# Patient Record
Sex: Female | Born: 1995 | Race: Black or African American | Hispanic: No | Marital: Single | Smoking: Never smoker
Health system: Southern US, Community
[De-identification: ages and names within clinical notes are randomized; demographics above are authoritative.]

## PROBLEM LIST (undated history)

## (undated) ENCOUNTER — Inpatient Hospital Stay (HOSPITAL_COMMUNITY): Payer: Self-pay

## (undated) DIAGNOSIS — R519 Headache, unspecified: Secondary | ICD-10-CM

## (undated) DIAGNOSIS — B3731 Acute candidiasis of vulva and vagina: Secondary | ICD-10-CM

## (undated) DIAGNOSIS — F329 Major depressive disorder, single episode, unspecified: Secondary | ICD-10-CM

## (undated) DIAGNOSIS — B373 Candidiasis of vulva and vagina: Secondary | ICD-10-CM

## (undated) DIAGNOSIS — B9689 Other specified bacterial agents as the cause of diseases classified elsewhere: Secondary | ICD-10-CM

## (undated) DIAGNOSIS — F419 Anxiety disorder, unspecified: Secondary | ICD-10-CM

## (undated) DIAGNOSIS — Z789 Other specified health status: Secondary | ICD-10-CM

## (undated) DIAGNOSIS — Z202 Contact with and (suspected) exposure to infections with a predominantly sexual mode of transmission: Secondary | ICD-10-CM

## (undated) DIAGNOSIS — F32A Depression, unspecified: Secondary | ICD-10-CM

## (undated) HISTORY — PX: NO PAST SURGERIES: SHX2092

---
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
## 2013-10-26 ENCOUNTER — Ambulatory Visit (INDEPENDENT_AMBULATORY_CARE_PROVIDER_SITE_OTHER): Payer: BC Managed Care – PPO | Admitting: Physician Assistant

## 2013-10-26 VITALS — BP 120/80 | HR 88 | Temp 98.6°F | Resp 18 | Ht 67.0 in | Wt 138.1 lb

## 2013-10-26 DIAGNOSIS — B9689 Other specified bacterial agents as the cause of diseases classified elsewhere: Secondary | ICD-10-CM

## 2013-10-26 DIAGNOSIS — Z113 Encounter for screening for infections with a predominantly sexual mode of transmission: Secondary | ICD-10-CM

## 2013-10-26 DIAGNOSIS — N760 Acute vaginitis: Secondary | ICD-10-CM

## 2013-10-26 DIAGNOSIS — Z309 Encounter for contraceptive management, unspecified: Secondary | ICD-10-CM

## 2013-10-26 DIAGNOSIS — N898 Other specified noninflammatory disorders of vagina: Secondary | ICD-10-CM

## 2013-10-26 LAB — POCT WET PREP WITH KOH
KOH Prep POC: NEGATIVE
Trichomonas, UA: NEGATIVE
Yeast Wet Prep HPF POC: NEGATIVE

## 2013-10-26 LAB — POCT URINE PREGNANCY: Preg Test, Ur: NEGATIVE

## 2013-10-26 MED ORDER — AMBULATORY NON FORMULARY MEDICATION: Status: DC

## 2013-10-26 MED ORDER — METRONIDAZOLE 0.75 % EX GEL: 1.0000 "application " | Freq: Two times a day (BID) | CUTANEOUS | Status: DC

## 2013-10-26 MED ORDER — NORGESTIMATE-ETH ESTRADIOL 0.25-35 MG-MCG PO TABS: 1.0000 | ORAL_TABLET | Freq: Every day | ORAL | Status: DC

## 2013-10-26 NOTE — Progress Notes (Signed)
Subjective:    Patient ID: Kathleen Zhang, female    DOB: 06/09/1996, 18 y.o.   MRN: [id number]  HPI 18 year old female presents for evaluation of recurrent BV and vaginal bleeding/spotting.  She started Depo provera in 20/2014 and has had bleeding and spotting ever since.  Admits she tried Depo before about 2 years ago and had the same problem - did not get her second shot then or now.  She is here today because she is concerned that she has BV. This has been a recurrent problem and she typically has it every 2-3 months.  She is sexually active with 1 female partner - has not been with him since 08/2013 - had STI screening in 09/2013 that was all negative.  States she uses condoms regularly.   Denies abdominal pain, nausea, vomiting, fever,chills, dysuria, urinary frequency, or flank pain.      Review of Systems  Constitutional: Negative for fever and chills.  Gastrointestinal: Negative for vomiting and abdominal pain.  Genitourinary: Positive for vaginal bleeding, vaginal discharge and menstrual problem. Negative for dysuria, frequency and vaginal pain.  Skin: Negative for rash.       Objective:   Physical Exam  Constitutional: She is oriented to person, place, and time. She appears well-developed and well-nourished.  HENT:  Head: Normocephalic and atraumatic.  Right Ear: External ear normal.  Left Ear: External ear normal.  Eyes: Conjunctivae are normal.  Neck: Normal range of motion.  Cardiovascular: Normal rate.   Pulmonary/Chest: Effort normal.  Genitourinary: Vagina normal and uterus normal. Pelvic exam was performed with patient supine. There is no rash, tenderness or lesion on the right labia. There is no rash, tenderness or lesion on the left labia. Cervix exhibits discharge. Cervix exhibits no motion tenderness. Right adnexum displays tenderness (slight right adnexal tenderness). Right adnexum displays no mass and no fullness. Left adnexum displays no mass, no tenderness and no  fullness.  Neurological: She is alert and oriented to person, place, and time.  Psychiatric: She has a normal mood and affect. Her behavior is normal. Judgment and thought content normal.    Results for orders placed in visit on 10/26/13  POCT WET PREP WITH KOH      Result Value Ref Range   Trichomonas, UA Negative     Clue Cells Wet Prep HPF POC 0-3     Epithelial Wet Prep HPF POC 2-10     Yeast Wet Prep HPF POC NEG     Bacteria Wet Prep HPF POC 2+     RBC Wet Prep HPF POC tntc     WBC Wet Prep HPF POC 0-2     KOH Prep POC Negative    POCT URINE PREGNANCY      Result Value Ref Range   Preg Test, Ur Negative           Assessment & Plan:  Leukorrhea, not specified as infective - Plan: POCT Wet Prep with KOH  Contraception management - Plan: POCT urine pregnancy, norgestimate-ethinyl estradiol (ORTHO-CYCLEN,SPRINTEC,PREVIFEM) 0.25-35 MG-MCG tablet  Screening for STD (sexually transmitted disease) - Plan: GC/Chlamydia Probe Amp  Bacterial vaginosis - Plan: AMBULATORY NON FORMULARY MEDICATION, metroNIDAZOLE (METROGEL) 0.75 % gel  Will treat for suspected BV with metrogel PV bid x 5 days Start boric acid suppositories after completion of metrogel - once PV at bedtime x 21 days Genprobe pending DUB likely due to depo-provera - treat with OCP's to start today. May continue if desired to use as contraception.  Discussed Nexplanon - ok to place GYN referral if she wishes to proceed with this.  Follow up if symptoms worsen or fail to improve.

## 2013-10-27 ENCOUNTER — Telehealth: Payer: Self-pay | Admitting: *Deleted

## 2013-10-27 MED ORDER — METRONIDAZOLE 0.75 % VA GEL: 1.0000 | Freq: Two times a day (BID) | VAGINAL | Status: DC

## 2013-10-27 NOTE — Telephone Encounter (Signed)
rx for metrogel was sent in as topical and it was supposed to be vaginal gel.  rx for vaginal gel sent in.

## 2013-10-28 LAB — GC/CHLAMYDIA PROBE AMP
CT Probe RNA: NEGATIVE
GC Probe RNA: NEGATIVE

## 2013-11-29 ENCOUNTER — Emergency Department (HOSPITAL_COMMUNITY)
Admission: EM | Admit: 2013-11-29 | Discharge: 2013-11-29 | Disposition: A | Discharge status: Home / Self Care | Payer: BC Managed Care – PPO | Attending: Emergency Medicine | Admitting: Emergency Medicine

## 2013-11-29 ENCOUNTER — Encounter (HOSPITAL_COMMUNITY): Payer: Self-pay | Admitting: Emergency Medicine

## 2013-11-29 ENCOUNTER — Emergency Department (HOSPITAL_COMMUNITY): Payer: BC Managed Care – PPO

## 2013-11-29 DIAGNOSIS — S01511A Laceration without foreign body of lip, initial encounter: Secondary | ICD-10-CM

## 2013-11-29 DIAGNOSIS — R1110 Vomiting, unspecified: Secondary | ICD-10-CM | POA: Insufficient documentation

## 2013-11-29 DIAGNOSIS — W19XXXA Unspecified fall, initial encounter: Secondary | ICD-10-CM | POA: Insufficient documentation

## 2013-11-29 DIAGNOSIS — S0993XA Unspecified injury of face, initial encounter: Secondary | ICD-10-CM

## 2013-11-29 DIAGNOSIS — Y929 Unspecified place or not applicable: Secondary | ICD-10-CM | POA: Insufficient documentation

## 2013-11-29 DIAGNOSIS — Z3202 Encounter for pregnancy test, result negative: Secondary | ICD-10-CM | POA: Insufficient documentation

## 2013-11-29 DIAGNOSIS — Y939 Activity, unspecified: Secondary | ICD-10-CM | POA: Insufficient documentation

## 2013-11-29 DIAGNOSIS — F1010 Alcohol abuse, uncomplicated: Secondary | ICD-10-CM | POA: Insufficient documentation

## 2013-11-29 DIAGNOSIS — S0990XA Unspecified injury of head, initial encounter: Secondary | ICD-10-CM | POA: Insufficient documentation

## 2013-11-29 DIAGNOSIS — Z79899 Other long term (current) drug therapy: Secondary | ICD-10-CM | POA: Insufficient documentation

## 2013-11-29 DIAGNOSIS — F10929 Alcohol use, unspecified with intoxication, unspecified: Secondary | ICD-10-CM

## 2013-11-29 DIAGNOSIS — S025XXA Fracture of tooth (traumatic), initial encounter for closed fracture: Secondary | ICD-10-CM | POA: Insufficient documentation

## 2013-11-29 DIAGNOSIS — R4182 Altered mental status, unspecified: Secondary | ICD-10-CM | POA: Insufficient documentation

## 2013-11-29 DIAGNOSIS — Z8619 Personal history of other infectious and parasitic diseases: Secondary | ICD-10-CM | POA: Insufficient documentation

## 2013-11-29 DIAGNOSIS — S01501A Unspecified open wound of lip, initial encounter: Secondary | ICD-10-CM | POA: Insufficient documentation

## 2013-11-29 DIAGNOSIS — IMO0002 Reserved for concepts with insufficient information to code with codable children: Secondary | ICD-10-CM | POA: Insufficient documentation

## 2013-11-29 LAB — COMPREHENSIVE METABOLIC PANEL
ALBUMIN: 4 g/dL (ref 3.5–5.2)
ALT: 12 U/L (ref 0–35)
AST: 21 U/L (ref 0–37)
Alkaline Phosphatase: 56 U/L (ref 39–117)
BUN: 9 mg/dL (ref 6–23)
CALCIUM: 8.7 mg/dL (ref 8.4–10.5)
CO2: 19 mEq/L (ref 19–32)
Chloride: 106 mEq/L (ref 96–112)
Creatinine, Ser: 0.67 mg/dL (ref 0.50–1.10)
GFR calc non Af Amer: >90 mL/min (ref >90)
Glucose, Bld: 170 mg/dL — ABNORMAL HIGH (ref 70–99)
POTASSIUM: 4 meq/L (ref 3.7–5.3)
SODIUM: 143 meq/L (ref 137–147)
TOTAL PROTEIN: 8 g/dL (ref 6.0–8.3)

## 2013-11-29 LAB — RAPID URINE DRUG SCREEN, HOSP PERFORMED
Amphetamines: NOT DETECTED
Barbiturates: NOT DETECTED
Benzodiazepines: NOT DETECTED
COCAINE: NOT DETECTED
Opiates: NOT DETECTED
TETRAHYDROCANNABINOL: NOT DETECTED

## 2013-11-29 LAB — CBC
HEMATOCRIT: 38.7 % (ref 36.0–46.0)
Hemoglobin: 13.8 g/dL (ref 12.0–15.0)
MCH: 31.7 pg (ref 26.0–34.0)
MCHC: 35.7 g/dL (ref 30.0–36.0)
MCV: 89 fL (ref 78.0–100.0)
PLATELETS: 209 10*3/uL (ref 150–400)
RBC: 4.35 MIL/uL (ref 3.87–5.11)
RDW: 11.5 % (ref 11.5–15.5)
WBC: 6.3 10*3/uL (ref 4.0–10.5)

## 2013-11-29 LAB — POC URINE PREG, ED: Preg Test, Ur: NEGATIVE

## 2013-11-29 LAB — ETHANOL: Alcohol, Ethyl (B): 272 mg/dL — ABNORMAL HIGH (ref 0–11)

## 2013-11-29 MED ORDER — ONDANSETRON HCL 4 MG/2ML IJ SOLN
4.0000 mg | Freq: Once | INTRAMUSCULAR | Status: AC
  Administered 2013-11-29: 4 mg via INTRAVENOUS
  Filled 2013-11-29: qty 2

## 2013-11-29 MED ORDER — ONDANSETRON HCL 4 MG/2ML IJ SOLN: 4.0000 mg | Freq: Once | INTRAMUSCULAR | Status: AC

## 2013-11-29 MED ORDER — ACETAMINOPHEN 325 MG PO TABS
650.0000 mg | ORAL_TABLET | Freq: Once | ORAL | Status: AC
  Administered 2013-11-29: 650 mg via ORAL
  Filled 2013-11-29: qty 2

## 2013-11-29 MED ORDER — ONDANSETRON HCL 8 MG PO TABS: 8.0000 mg | ORAL_TABLET | Freq: Three times a day (TID) | ORAL | Status: DC | PRN

## 2013-11-29 MED ORDER — ONDANSETRON HCL 4 MG/2ML IJ SOLN
INTRAMUSCULAR | Status: AC
  Administered 2013-11-29: 4 mg via INTRAVENOUS
  Filled 2013-11-29: qty 2

## 2013-11-29 MED ORDER — ONDANSETRON HCL 4 MG/2ML IJ SOLN
4.0000 mg | Freq: Once | INTRAMUSCULAR | Status: AC
  Administered 2013-11-29: 4 mg via INTRAVENOUS

## 2013-11-29 NOTE — ED Provider Notes (Signed)
CSN: [id number]     Arrival date & time 11/29/13  0140 History   First MD Initiated Contact with Patient 11/29/13 [phone]     Chief Complaint  Patient presents with  . Alcohol Intoxication  . Altered Mental Status  . Emesis  . Head Injury     (Consider location/radiation/quality/duration/timing/severity/associated sxs/prior Treatment) Patient is a 18 y.o. female presenting with intoxication, altered mental status, vomiting, and head injury. The history is provided by the patient and a parent.  Alcohol Intoxication  Altered Mental Status Associated symptoms: vomiting   Emesis Head Injury Associated symptoms: vomiting    Parents bring her here for evaluation after a fall, and suspected alcohol intoxication. The patient was out with friends tonight, when her mother called her cell phone, and discovered that she had been drinking. The parents, took her home, then brought her here after they realized that she had a cut lip.  Level V caveat- altered mental status   Past Medical History  Diagnosis Date  . Chlamydia    History reviewed. No pertinent past surgical history. No family history on file. History  Substance Use Topics  . Smoking status: Never Smoker   . Smokeless tobacco: Never Used  . Alcohol Use: Yes   OB History   Grav Para Term Preterm Abortions TAB SAB Ect Mult Living                 Review of Systems  Gastrointestinal: Positive for vomiting.  All other systems reviewed and are negative.     Allergies  Review of patient's allergies indicates no known allergies.  Home Medications   Prior to Admission medications   Medication Sig Start Date End Date Taking? Authorizing Provider  AMBULATORY NON FORMULARY MEDICATION Boric Acid Suppository 600 mg Insert 1 PV at bedtime x 3 weeks 10/26/13   Nelva Nay, PA-C  metroNIDAZOLE (METROGEL VAGINAL) 0.75 % vaginal gel Place 1 Applicatorful vaginally 2 (two) times daily. 10/27/13   Heather Jaquita Rector, PA-C   norgestimate-ethinyl estradiol (ORTHO-CYCLEN,SPRINTEC,PREVIFEM) 0.25-35 MG-MCG tablet Take 1 tablet by mouth daily. 10/26/13   Heather M Marte, PA-C   BP 111/49  Pulse 96  Temp(Src) 97.9 F (36.6 C) (Rectal)  Resp 17  SpO2 100% Physical Exam  Nursing note and vitals reviewed. Constitutional: She is oriented to person, place, and time. She appears well-developed and well-nourished.  HENT:  Head: Normocephalic.  No cranial deformity, crepitation, or laceration. Mucosal laceration, and not gaping, or bleeding of the middle, lower lip. No evident dental trauma.  Eyes: Conjunctivae and EOM are normal. Pupils are equal, round, and reactive to light.  Neck: Normal range of motion and phonation normal.  Cardiovascular: Normal rate, regular rhythm and intact distal pulses.   Pulmonary/Chest: Effort normal and breath sounds normal. She exhibits no tenderness.  Abdominal: Soft. She exhibits no distension. There is no tenderness. There is no guarding.  Musculoskeletal: Normal range of motion.  Neurological: She is oriented to person, place, and time. She exhibits normal muscle tone.  She is obtunded. She does not follow commands. During the exam, she sat up, began crying and asking for her mother.  Skin: Skin is warm and dry.  Minor bilateral anterior knee abrasions  Psychiatric: She has a normal mood and affect. Her behavior is normal. Judgment and thought content normal.    ED Course  Procedures (including critical care time)   Medications  ondansetron (ZOFRAN) injection 4 mg (4 mg Intravenous Given 11/29/13 0152)  ondansetron (ZOFRAN) injection  4 mg (0 mg Intravenous Duplicate 11/29/13 0202)  ondansetron (ZOFRAN) injection 4 mg (4 mg Intravenous Given 11/29/13 0619)  acetaminophen (TYLENOL) tablet 650 mg (650 mg Oral Given 11/29/13 0620)    Patient Vitals for the past 24 hrs:  BP Temp Temp src Pulse Resp SpO2  11/29/13 0445 111/49 mmHg - - - 17 -  11/29/13 0415 122/74 mmHg - - 96 14 100 %   11/29/13 0345 102/52 mmHg - - 85 17 97 %  11/29/13 0315 112/52 mmHg - - 93 18 98 %  11/29/13 0301 110/57 mmHg - - 89 17 100 %  11/29/13 0145 - 97.9 F (36.6 C) Rectal - - -  11/29/13 0143 97/83 mmHg - - 91 12 97 %    7:14 AM Reevaluation with update and discussion. After initial assessment and treatment, an updated evaluation reveals she is alert and nonconversant, still confused. She complains of a chipped lower a anterior incisor. Findings discussed with patient and family member, all questions answered. Flint MelterElliott L Marqueta Pulley     Labs Review Labs Reviewed  COMPREHENSIVE METABOLIC PANEL - Abnormal; Notable for the following:    Glucose, Bld 170 (*)    Total Bilirubin <0.2 (*)    All other components within normal limits  ETHANOL - Abnormal; Notable for the following:    Alcohol, Ethyl (B) 272 (*)    All other components within normal limits  CBC  URINE RAPID DRUG SCREEN (HOSP PERFORMED)  POC URINE PREG, ED    Imaging Review Ct Head Wo Contrast  11/29/2013   CLINICAL DATA:  Recent traumatic injury  EXAM: CT HEAD WITHOUT CONTRAST  TECHNIQUE: Contiguous axial images were obtained from the base of the skull through the vertex without intravenous contrast.  COMPARISON:  None.  FINDINGS: The bony calvarium is intact. The ventricles are normal size and configuration. No findings to suggest acute hemorrhage, acute infarction or space-occupying mass lesion are noted.  IMPRESSION: No acute abnormality noted.   Electronically Signed   By: Alcide CleverMark  Lukens M.D.   On: 11/29/2013 02:29   Ct Cervical Spine Wo Contrast  11/29/2013   CLINICAL DATA:  Altered mental status.  Patient fell and hit head.  EXAM: CT CERVICAL SPINE WITHOUT CONTRAST  TECHNIQUE: Multidetector CT imaging of the cervical spine was performed without intravenous contrast. Multiplanar CT image reconstructions were also generated.  COMPARISON:  CT HEAD W/O CM dated 11/29/2013  FINDINGS: There is reversal of the usual cervical lordosis which  is probably due to patient positioning but ligamentous injury or muscle spasm can also have this appearance and are not excluded. No anterior subluxation. Facet joints demonstrate normal alignment. C1-2 articulation appears intact. No focal bone lesion or bone destruction. Bone cortex and trabecular architecture appear intact. No vertebral compression deformities. No prevertebral soft tissue swelling.  IMPRESSION: Nonspecific reversal of the usual cervical lordosis. No displaced fractures are identified.   Electronically Signed   By: Burman NievesWilliam  Stevens M.D.   On: 11/29/2013 06:13     EKG Interpretation None      MDM   Final diagnoses:  Alcohol intoxication  Fall  Laceration of lip  Dental injury   Alcohol intoxication with fall, but no serious injury. Her parents are taking her home and are able to manage her there.  Nursing Notes Reviewed/ Care Coordinated Applicable Imaging Reviewed Interpretation of Laboratory Data incorporated into ED treatment  The patient appears reasonably screened and/or stabilized for discharge and I doubt any other medical condition or other  EMC requiring further screening, evaluation, or treatment in the ED at this time prior to discharge.  Plan: Home Medications- Zofran; Home Treatments- gradually advance diet; return here if the recommended treatment, does not improve the symptoms; Recommended follow up- PCP of choice prn. See dentist for check up asap    Flint MelterElliott L Danette Weinfeld, MD 11/29/13 [phone]

## 2013-11-29 NOTE — Discharge Instructions (Signed)
Avoid alcohol in heavy doses. Gradually advance her diet today. See, your dentist as soon as possible about the dental injury.   Alcohol Intoxication Alcohol intoxication occurs when the amount of alcohol that a person has consumed impairs his or her ability to mentally and physically function. Alcohol directly impairs the normal chemical activity of the brain. Drinking large amounts of alcohol can lead to changes in mental function and behavior, and it can cause many physical effects that can be harmful.  Alcohol intoxication can range in severity from mild to very severe. Various factors can affect the level of intoxication that occurs, such as the person's age, gender, weight, frequency of alcohol consumption, and the presence of other medical conditions (such as diabetes, seizures, or heart conditions). Dangerous levels of alcohol intoxication may occur when people drink large amounts of alcohol in a short period (binge drinking). Alcohol can also be especially dangerous when combined with certain prescription medicines or "recreational" drugs. SIGNS AND SYMPTOMS Some common signs and symptoms of mild alcohol intoxication include:  Loss of coordination.  Changes in mood and behavior.  Impaired judgment.  Slurred speech. As alcohol intoxication progresses to more severe levels, other signs and symptoms will appear. These may include:  Vomiting.  Confusion and impaired memory.  Slowed breathing.  Seizures.  Loss of consciousness. DIAGNOSIS  Your health care provider will take a medical history and perform a physical exam. You will be asked about the amount and type of alcohol you have consumed. Blood tests will be done to measure the concentration of alcohol in your blood. In many places, your blood alcohol level must be lower than 80 mg/dL (1.61%0.08%) to legally drive. However, many dangerous effects of alcohol can occur at much lower levels.  TREATMENT  People with alcohol  intoxication often do not require treatment. Most of the effects of alcohol intoxication are temporary, and they go away as the alcohol naturally leaves the body. Your health care provider will monitor your condition until you are stable enough to go home. Fluids are sometimes given through an IV access tube to help prevent dehydration.  HOME CARE INSTRUCTIONS  Do not drive after drinking alcohol.  Stay hydrated. Drink enough water and fluids to keep your urine clear or pale yellow. Avoid caffeine.   Only take over-the-counter or prescription medicines as directed by your health care provider.  SEEK MEDICAL CARE IF:   You have persistent vomiting.   You do not feel better after a few days.  You have frequent alcohol intoxication. Your health care provider can help determine if you should see a substance use treatment counselor. SEEK IMMEDIATE MEDICAL CARE IF:   You become shaky or tremble when you try to stop drinking.   You shake uncontrollably (seizure).   You throw up (vomit) blood. This may be bright red or may look like black coffee grounds.   You have blood in your stool. This may be bright red or may appear as a black, tarry, bad smelling stool.   You become lightheaded or faint.  MAKE SURE YOU:   Understand these instructions.  Will watch your condition.  Will get help right away if you are not doing well or get worse. Document Released: 04/16/2005 Document Revised: 03/09/2013 Document Reviewed: 12/10/2012 [facility] Patient Information 2014 [place], [place].  Dental Injury Your exam shows that you have injured your teeth. The treatment of broken teeth and other dental injuries depends on how badly they are hurt. All dental injuries should be  checked as soon as possible by a dentist if there are:  Loose teeth which may need to be wired or bonded with a plastic device to hold them in place.  Broken teeth with exposed tooth pulp which may cause a serious  infection.  Painful teeth especially when you bite or chew.  Sharp tooth edges that cut your tongue or lips. Sometimes, antibiotics or pain medicine are prescribed to prevent infection and control pain. Eat a soft or liquid diet and rinse your mouth out after meals with warm water. You should see a dentist or return here at once if you have increased swelling, increased pain or uncontrolled bleeding from the site of your injury. SEEK MEDICAL CARE IF:   You have increased pain not controlled with medicines.  You have swelling around your tooth, in your face or neck.  You have bleeding which starts, continues, or gets worse.  You have a fever. Document Released: 07/07/2005 Document Revised: 09/29/2011 Document Reviewed: 07/06/2009 [facility] Patient Information 2014 [place], [place].  Head Injury, Adult You have a head injury. Headaches and throwing up (vomiting) are common after a head injury. It should be easy to wake up from sleeping. Sometimes you must stay in the hospital. Most problems happen within the first 24 hours. Side effects may occur up to 7 10 days after the injury.  WHAT ARE THE TYPES OF HEAD INJURIES? Head injuries can be as minor as a bump. Some head injuries can be more severe. More severe head injuries include:  A jarring injury to the brain (concussion).  A bruise of the brain (contusion). This mean there is bleeding in the brain that can cause swelling.  A cracked skull (skull fracture).  Bleeding in the brain that collects, clots, and forms a bump (hematoma). . WHEN SHOULD I GET HELP RIGHT AWAY?   You are confused or sleepy.  You cannot be woken up.  You feel sick to your stomach (nauseous) or keep throwing up.  Your dizziness or unsteadiness is get worse.  You have very bad, lasting headaches that are not helped by medicine.  You cannot use your arms or legs like normal  You cannot walk.  You notice changes in the black spots in the center of the  colored part of your eye (pupil).  You have clear or bloody fluid coming from your nose or ears.  You have trouble seeing. During the next 24 hours after the injury, you must stay with someone who can watch you. This person should get help right away (call 911 in the [place] if you start to shake and are not able to control it (seizures), you become pass out, or you are unable to wake up. HOW CAN I PREVENT A HEAD INJURY IN THE FUTURE?  Wear seat belts.  Wear helmets while bike riding and playing sports like football.  Stay away from dangerous activities around the house. WHEN CAN I RETURN TO NORMAL ACTIVITIES AND ATHLETICS? See your doctor before doing these activities. You should not do normal activities or play contact sports until 1 week after the following symptoms have stopped:  Headache that does not go away.  Dizziness.  Poor attention.  Confusion.  Memory problems.  Sickness to your stomach or throwing up.  Tiredness.  Fussiness.  Bothered by bright lights or loud noises.  Anxiousness or depression.  Restless sleep. MAKE SURE YOU:   Understand these instructions.  Will watch your condition.  Will get help right away if you are  not doing well or get worse. Document Released: 06/19/2008 Document Revised: 04/27/2013 Document Reviewed: 03/14/2013 [facility] Patient Information 2014 [place], [place].

## 2013-11-29 NOTE — ED Notes (Signed)
Pt presents with c/o alcohol intoxication and altered mental status. Per pt's mother, pt was at a friend's house and mom called her cell phone and someone else answered the phone. Pt's friend reports that pt had been drinking and that she fell on the ground and hit her head. Pt has some bleeding to her lip, controlled at this time.

## 2014-01-23 ENCOUNTER — Ambulatory Visit (INDEPENDENT_AMBULATORY_CARE_PROVIDER_SITE_OTHER): Payer: BC Managed Care – PPO | Admitting: Physician Assistant

## 2014-01-23 VITALS — BP 110/60 | HR 94 | Temp 98.0°F | Resp 16 | Ht 66.0 in | Wt 137.0 lb

## 2014-01-23 DIAGNOSIS — Z30018 Encounter for initial prescription of other contraceptives: Secondary | ICD-10-CM

## 2014-01-23 DIAGNOSIS — N898 Other specified noninflammatory disorders of vagina: Secondary | ICD-10-CM

## 2014-01-23 DIAGNOSIS — N760 Acute vaginitis: Secondary | ICD-10-CM

## 2014-01-23 DIAGNOSIS — B9689 Other specified bacterial agents as the cause of diseases classified elsewhere: Secondary | ICD-10-CM

## 2014-01-23 DIAGNOSIS — Z113 Encounter for screening for infections with a predominantly sexual mode of transmission: Secondary | ICD-10-CM

## 2014-01-23 DIAGNOSIS — Z3009 Encounter for other general counseling and advice on contraception: Secondary | ICD-10-CM

## 2014-01-23 DIAGNOSIS — A499 Bacterial infection, unspecified: Secondary | ICD-10-CM

## 2014-01-23 LAB — POCT WET PREP WITH KOH
KOH Prep POC: NEGATIVE
Trichomonas, UA: NEGATIVE
Yeast Wet Prep HPF POC: NEGATIVE

## 2014-01-23 MED ORDER — METRONIDAZOLE 0.75 % VA GEL: 1.0000 | Freq: Every day | VAGINAL | Status: DC

## 2014-01-23 MED ORDER — AMBULATORY NON FORMULARY MEDICATION: Status: DC

## 2014-01-23 NOTE — Progress Notes (Signed)
   Subjective:    Patient ID: Kathleen Zhang, female    DOB: 16-May-1996, 18 y.o.   MRN: [id number]  HPI 18 year old female presents for evaluation of possible recurrence of BV. Was treated on 4/8 with Flagyl and started on 3 week course of boric acid qhs.  States that was working well and she has had no problems until this morning when she had intercourse and noticed a fishy odor. Has not noticed any discharge. Denies abdominal pain, dysuria, frequency, rash, fever, or chills.  She is sexually active with 1 female partner. Admits they did "take a break" for about 1 week and while she did not have any other sexual partners she is concerned maybe he did so would like to be screened today.  Had negative GC/CL in 10/2012. Neg HIV in 10/14.  Continues on OCP's. Admits she does miss doses occasionally; not using condoms. Does not wish to become pregnant at this time. Has considered nexplanon and would like to be scheduled to see a GYN for this.     Review of Systems  Constitutional: Negative for fever and chills.  Gastrointestinal: Negative for nausea, vomiting and abdominal pain.  Genitourinary: Negative for dysuria, frequency, vaginal discharge (+odor) and pelvic pain.  Skin: Negative for rash.       Objective:   Physical Exam  Constitutional: She is oriented to person, place, and time. She appears well-developed and well-nourished.  HENT:  Head: Normocephalic and atraumatic.  Right Ear: External ear normal.  Left Ear: External ear normal.  Eyes: Conjunctivae are normal.  Neck: Normal range of motion.  Cardiovascular: Normal rate.   Pulmonary/Chest: Effort normal.  Genitourinary: Vagina normal and uterus normal. Pelvic exam was performed with patient supine. There is no rash or lesion on the right labia. There is no rash or lesion on the left labia. Cervix exhibits no motion tenderness, no discharge and no friability. Right adnexum displays no mass, no tenderness and no fullness. Left adnexum  displays no mass, no tenderness and no fullness.  Neurological: She is alert and oriented to person, place, and time.  Psychiatric: She has a normal mood and affect. Her behavior is normal. Judgment and thought content normal.    Results for orders placed in visit on 01/23/14  POCT WET PREP WITH KOH      Result Value Ref Range   Trichomonas, UA Negative     Clue Cells Wet Prep HPF POC 1-6     Epithelial Wet Prep HPF POC 1-10     Yeast Wet Prep HPF POC neg     Bacteria Wet Prep HPF POC 2+     RBC Wet Prep HPF POC 0-1     WBC Wet Prep HPF POC 2-3     KOH Prep POC Negative           Assessment & Plan:  Encounter for initial prescription of other contraceptives - Plan: Ambulatory referral to Gynecology  Screening for STD (sexually transmitted disease) - Plan: GC/Chlamydia Probe Amp  Leukorrhea, not specified as infective - Plan: POCT Wet Prep with KOH, GC/Chlamydia Probe Amp  Will treat BV with metrogel pv qhs x 5 days Refilled rx for boric acid suppositories to use prn  GC/CL pending.  Follow up if symptoms worsen or fail to improve.

## 2014-01-24 ENCOUNTER — Other Ambulatory Visit: Payer: Self-pay | Admitting: Physician Assistant

## 2014-01-24 ENCOUNTER — Ambulatory Visit (INDEPENDENT_AMBULATORY_CARE_PROVIDER_SITE_OTHER): Payer: BC Managed Care – PPO | Admitting: *Deleted

## 2014-01-24 DIAGNOSIS — A549 Gonococcal infection, unspecified: Secondary | ICD-10-CM

## 2014-01-24 DIAGNOSIS — Z113 Encounter for screening for infections with a predominantly sexual mode of transmission: Secondary | ICD-10-CM

## 2014-01-24 LAB — GC/CHLAMYDIA PROBE AMP
CT Probe RNA: NEGATIVE
GC Probe RNA: POSITIVE — AB

## 2014-01-24 MED ORDER — CEFTRIAXONE SODIUM 1 G IJ SOLR
250.0000 mg | Freq: Once | INTRAMUSCULAR | Status: AC
  Administered 2014-01-24: 250 mg via INTRAMUSCULAR

## 2014-01-24 MED ORDER — AZITHROMYCIN 500 MG PO TABS: 1000.0000 mg | ORAL_TABLET | Freq: Once | ORAL | Status: DC

## 2014-02-08 ENCOUNTER — Ambulatory Visit (INDEPENDENT_AMBULATORY_CARE_PROVIDER_SITE_OTHER): Payer: BC Managed Care – PPO | Admitting: Women's Health

## 2014-02-08 ENCOUNTER — Encounter: Payer: Self-pay | Admitting: Women's Health

## 2014-02-08 VITALS — BP 117/68 | Ht 67.0 in | Wt 136.8 lb

## 2014-02-08 DIAGNOSIS — A5400 Gonococcal infection of lower genitourinary tract, unspecified: Secondary | ICD-10-CM

## 2014-02-08 DIAGNOSIS — Z113 Encounter for screening for infections with a predominantly sexual mode of transmission: Secondary | ICD-10-CM

## 2014-02-08 DIAGNOSIS — A549 Gonococcal infection, unspecified: Secondary | ICD-10-CM

## 2014-02-08 LAB — WET PREP FOR TRICH, YEAST, CLUE
CLUE CELLS WET PREP: NONE SEEN
Trich, Wet Prep: NONE SEEN
Yeast Wet Prep HPF POC: NONE SEEN

## 2014-02-08 LAB — RPR

## 2014-02-08 NOTE — Progress Notes (Signed)
Patient ID: Kathleen Zhang, female   DOB: 19-Jun-1996, 18 y.o.   MRN: [id number] Presents as a new patient to discuss contraception. Had a recent annual exam at primary care with positive GC treated with Zithromax and Rocephin on 01/24/2014 has abstained. Has had  recurrent bacteria vaginosis was treated with MetroGel 2 weeks ago and used boric gel caps in the past. Completed gardasil series. [place] [employer] [place] now moved back to go to local school. Contraceptives on Ortho-Cyclen and occasionally forgets pills.  Exam: Appears well. Abdomen soft nontender no rebound or radiation of pain. External genitalia within normal limits, speculum exam scant white discharge noted wet prep negative, GC/Chlamydia culture pending. Bimanual no CMT or adnexal fullness or tenderness.  Contraception management Test of cure gonorrhea  Plan: HIV, hep B, C., RPR. GC/Chlamydia culture pending. Reviewed importance of condoms if sexually active. Contraception options reviewed, nexplanon , reviewed  risk of irregular spotting. Will schedule with Dr. Lily PeerFernandez, will continue pills until placed, abstinence, condoms until permanent partner.

## 2014-02-08 NOTE — Patient Instructions (Signed)
Etonogestrel implant What is this medicine? ETONOGESTREL (et oh noe JES trel) is a contraceptive (birth control) device. It is used to prevent pregnancy. It can be used for up to 3 years. This medicine may be used for other purposes; ask your health care provider or pharmacist if you have questions. COMMON BRAND NAME(S): Implanon, Nexplanon What should I tell my health care provider before I take this medicine? They need to know if you have any of these conditions: -abnormal vaginal bleeding -blood vessel disease or blood clots -cancer of the breast, cervix, or liver -depression -diabetes -gallbladder disease -headaches -heart disease or recent heart attack -high blood pressure -high cholesterol -kidney disease -liver disease -renal disease -seizures -tobacco smoker -an unusual or allergic reaction to etonogestrel, other hormones, anesthetics or antiseptics, medicines, foods, dyes, or preservatives -pregnant or trying to get pregnant -breast-feeding How should I use this medicine? This device is inserted just under the skin on the inner side of your upper arm by a health care professional. Talk to your pediatrician regarding the use of this medicine in children. Special care may be needed. Overdosage: If you think you've taken too much of this medicine contact a poison control center or emergency room at once. Overdosage: If you think you have taken too much of this medicine contact a poison control center or emergency room at once. NOTE: This medicine is only for you. Do not share this medicine with others. What if I miss a dose? This does not apply. What may interact with this medicine? Do not take this medicine with any of the following medications: -amprenavir -bosentan -fosamprenavir This medicine may also interact with the following medications: -barbiturate medicines for inducing sleep or treating seizures -certain medicines for fungal infections like ketoconazole and  itraconazole -griseofulvin -medicines to treat seizures like carbamazepine, felbamate, oxcarbazepine, phenytoin, topiramate -modafinil -phenylbutazone -rifampin -some medicines to treat HIV infection like atazanavir, indinavir, lopinavir, nelfinavir, tipranavir, ritonavir -St. John's wort This list may not describe all possible interactions. Give your health care provider a list of all the medicines, herbs, non-prescription drugs, or dietary supplements you use. Also tell them if you smoke, drink alcohol, or use illegal drugs. Some items may interact with your medicine. What should I watch for while using this medicine? This product does not protect you against HIV infection (AIDS) or other sexually transmitted diseases. You should be able to feel the implant by pressing your fingertips over the skin where it was inserted. Tell your doctor if you cannot feel the implant. What side effects may I notice from receiving this medicine? Side effects that you should report to your doctor or health care professional as soon as possible: -allergic reactions like skin rash, itching or hives, swelling of the face, lips, or tongue -breast lumps -changes in vision -confusion, trouble speaking or understanding -dark urine -depressed mood -general ill feeling or flu-like symptoms -light-colored stools -loss of appetite, nausea -right upper belly pain -severe headaches -severe pain, swelling, or tenderness in the abdomen -shortness of breath, chest pain, swelling in a leg -signs of pregnancy -sudden numbness or weakness of the face, arm or leg -trouble walking, dizziness, loss of balance or coordination -unusual vaginal bleeding, discharge -unusually weak or tired -yellowing of the eyes or skin Side effects that usually do not require medical attention (Report these to your doctor or health care professional if they continue or are bothersome.): -acne -breast pain -changes in  weight -cough -fever or chills -headache -irregular menstrual bleeding -itching, burning, and   vaginal discharge -pain or difficulty passing urine -sore throat This list may not describe all possible side effects. Call your doctor for medical advice about side effects. You may report side effects to FDA at 1-800-FDA-1088. Where should I keep my medicine? This drug is given in a hospital or clinic and will not be stored at home. NOTE: This sheet is a summary. It may not cover all possible information. If you have questions about this medicine, talk to your doctor, pharmacist, or health care provider.  2015, [employer]vier/[employer] Standard. (2012-01-12 15:37:45)  

## 2014-02-09 ENCOUNTER — Telehealth: Payer: Self-pay | Admitting: Gynecology

## 2014-02-09 LAB — HIV ANTIBODY (ROUTINE TESTING W REFLEX): HIV: NONREACTIVE

## 2014-02-09 LAB — HEPATITIS B SURFACE ANTIGEN: HEP B S AG: NEGATIVE

## 2014-02-09 LAB — GC/CHLAMYDIA PROBE AMP
CT Probe RNA: NEGATIVE
GC Probe RNA: NEGATIVE

## 2014-02-09 LAB — HEPATITIS C ANTIBODY: HCV AB: NEGATIVE

## 2014-02-09 NOTE — Telephone Encounter (Signed)
02/09/14-I was unable to LM for pt as her voice mail is not set up. She wanted her benefits checked for a Neplanon and insertion. Her BC covers that at 100%, no copay. She will need to schedule with JF per [place]/wl

## 2014-05-04 ENCOUNTER — Telehealth: Payer: Self-pay | Admitting: *Deleted

## 2014-05-04 ENCOUNTER — Ambulatory Visit (INDEPENDENT_AMBULATORY_CARE_PROVIDER_SITE_OTHER): Payer: BC Managed Care – PPO | Admitting: Gynecology

## 2014-05-04 ENCOUNTER — Encounter: Payer: Self-pay | Admitting: Gynecology

## 2014-05-04 VITALS — BP 124/80 | Ht 66.0 in | Wt 141.0 lb

## 2014-05-04 DIAGNOSIS — Z202 Contact with and (suspected) exposure to infections with a predominantly sexual mode of transmission: Secondary | ICD-10-CM | POA: Insufficient documentation

## 2014-05-04 DIAGNOSIS — N979 Female infertility, unspecified: Secondary | ICD-10-CM

## 2014-05-04 MED ORDER — DOXYCYCLINE HYCLATE 100 MG PO CAPS: 100.0000 mg | ORAL_CAPSULE | Freq: Two times a day (BID) | ORAL | Status: DC

## 2014-05-04 NOTE — Telephone Encounter (Signed)
Per JF to schedule HSG (patient currently on menses now started oct 13) patient LMP: 05/02/14. Appointment on 05/11/14 @ 8:30 am order placed, pt informed.

## 2014-05-04 NOTE — Patient Instructions (Signed)
Hysterosalpingography Hysterosalpingography is a procedure to look inside your uterus and fallopian tubes. During this procedure, contrast dye is injected into your uterus through your vagina and cervix to illuminate your uterus while X-ray pictures are taken. This procedure may help your health care provider determine whether you have uterine tumors, adhesions, or structural abnormalities. It is commonly used to help determine why a woman is unable to have children (infertility). The procedure usually lasts about 15-30 minutes. LET YOUR HEALTH CARE PROVIDER KNOW ABOUT:  Any allergies you have.  All medicines you are taking, including vitamins, herbs, eye drops, creams, and over-the-counter medicines.  Previous problems you or members of your family have had with the use of anesthetics.  Any blood disorders you have.  Previous surgeries you have had.  Medical conditions you have. RISKS AND COMPLICATIONS  Generally, this is a safe procedure. However, as with any procedure, problems can occur. Possible problems include:  Infection in the lining of the uterus (endometritis) or fallopian tubes (salpingitis).  Damage or perforation of the uterus or fallopian tubes.  An allergic reaction to the contrast dye used to perform the X-ray. BEFORE THE PROCEDURE   Schedule the procedure after your period stops, but before your next ovulation. This is usually between day 5 and day 10 of your last period. Day 1 is the first day of your period.  Ask your health care provider about changing or stopping your regular medicines.  You may eat and drink as normal.  Empty your bladder before the procedure begins. PROCEDURE  You may be given a medicine to relax you (sedative) or an over-the-counter pain medicine to lessen any discomfort during the procedure.  You will lie down on an X-ray table with your feet in stirrups.  A device called a speculum will be placed into your vagina. This allows your  health care provider to see inside your vagina to the cervix.  The cervix will be washed with a special soap.  A thin, flexible tube will be passed through the cervix into the uterus.  Contrast dye will be put into this tube.  Several X-rays will be taken as the contrast dye spreads through the uterus and fallopian tubes.  The tube will be taken out after the procedure. AFTER THE PROCEDURE   Most of the contrast dye will flow out of your vagina naturally. You may want to wear a sanitary pad.  You may feel mild cramping and notice a little bleeding from your vagina. This should go away in 24 hours.  Ask when your test results will be ready. Make sure you get your test results. Document Released: 08/09/2004 Document Revised: 07/12/2013 Document Reviewed: 01/07/2013 [employer] Patient Information 2015 [employer]. This information is not intended to replace advice given to you by your health care provider. Make sure you discuss any questions you have with your health care provider.  

## 2014-05-04 NOTE — Progress Notes (Signed)
   Patient is a 18 year old gravida 0 new patient to the practice who wanted to discuss her issues with infertility. Patient has informed that she was treated 3 times in the past for Chlamydia infection and wants for gonorrhea in July of 2013. Patient has not been using any form of contraception. 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 In the past she had used contraception consisting of oral contraceptive pill and Depo-Provera injection. She reports having normal menstrual cycles. She received her gynecological exam at the GYN clinic in the hospital within the past year she stated her Pap smear was normal.  In July of this year patient had an RPR, GC and Chlamydia culture, hepatitis B and C. and HIV testing all were negative.  We had a lengthy discussion that perhaps the reason she may not be conceiving that there could be a possibility of her having tubal occlusion as a result of her 3 Chlamydia infections. We're going to schedule an HSG in the next few days since she's currently on her menses. For prophylaxis she will be placed on Vibramycin 100 mg twice a day for 3 days starting the day before procedure. She will then return to the office discuss the results and plan a course of management. If the HSG is normal we discussed that her partner may need to have a semen analysis. She states that her partner has a child with a previous partner and her child is now 984 years of age. Patient was instructed also to begin taking prenatal vitamins. She was instructed also to discontinue the boric acid capsules can sit could affect sperm migration. She can use an over-the-counter probiotic tablet that she should take daily. She'll also begin taking prenatal vitamins for neural tube prophylaxis.

## 2014-05-11 ENCOUNTER — Ambulatory Visit (HOSPITAL_COMMUNITY)
Admission: RE | Admit: 2014-05-11 | Discharge: 2014-05-11 | Disposition: A | Discharge status: Home / Self Care | Payer: BC Managed Care – PPO | Source: Ambulatory Visit | Attending: Gynecology | Admitting: Gynecology

## 2014-05-11 DIAGNOSIS — N979 Female infertility, unspecified: Secondary | ICD-10-CM | POA: Diagnosis not present

## 2014-05-11 DIAGNOSIS — N854 Malposition of uterus: Secondary | ICD-10-CM | POA: Insufficient documentation

## 2014-05-11 MED ORDER — IOHEXOL 300 MG/ML  SOLN
20.0000 mL | Freq: Once | INTRAMUSCULAR | Status: AC | PRN
  Administered 2014-05-11: 20 mL

## 2014-05-18 ENCOUNTER — Ambulatory Visit: Payer: BC Managed Care – PPO | Admitting: Gynecology

## 2014-06-02 ENCOUNTER — Encounter: Payer: Self-pay | Admitting: Gynecology

## 2014-06-02 ENCOUNTER — Ambulatory Visit (INDEPENDENT_AMBULATORY_CARE_PROVIDER_SITE_OTHER): Payer: BC Managed Care – PPO | Admitting: Gynecology

## 2014-06-02 VITALS — BP 130/86

## 2014-06-02 DIAGNOSIS — N912 Amenorrhea, unspecified: Secondary | ICD-10-CM

## 2014-06-02 DIAGNOSIS — N979 Female infertility, unspecified: Secondary | ICD-10-CM

## 2014-06-02 LAB — PREGNANCY, URINE: PREG TEST UR: NEGATIVE

## 2014-06-02 NOTE — Progress Notes (Signed)
Patient is a 18-year-old gravida 0 who presented to the office today to discuss her HSG. She was seen the office as a new patient October 15. History as follows:  Patient has informed that she was treated 3 times in the past for Chlamydia infection and once  for gonorrhea in July of 2013. Patient has not been using any form of contraception.  In July of this year patient had an RPR, GC and Chlamydia culture, hepatitis B and C. and HIV testing all were negative.She reported normal menstrual cycles. She took the Vibramycin 100 mg twice a day the day before and the day of the procedure. She states she had intercourse right after the procedure in the next day but did not take the antibiotic. She reports her last menstrual period October 13 which should be starting in the next few days. She denies any visual disturbances some slight breast tenderness but no headaches or nipple discharge.  Her partner has a child of 4 years of age. Her HSG result as follows:  FINDINGS: Uterine cavity contour is normal. Uterus is retroverted. Bilateral fallopian tubes have normal contour and peritoneal spill.  Patient had a negative urine pregnancy test today. If she does not have a cycle within the next 72 hours she will take Provera 10 mg 1 by mouth daily for 5-10 days if today's TSH and prolactin are normal. She will continue to the take the prenatal vitamins daily. I've given her written instruction on the utilization of ovulation predictor kit and timing of intercourse.    

## 2014-06-02 NOTE — Patient Instructions (Signed)
Infertility WHAT IS INFERTILITY?  Infertility is usually defined as not being able to get pregnant after trying for one year of regular sexual intercourse without the use of contraceptives. Or not being able to carry a pregnancy to term and have a baby. The infertility rate in the [place] is around 10%. Pregnancy is the result of a chain of events. A woman must release an egg from one of her ovaries (ovulation). The egg must be fertilized by the female sperm. Then it travels through a fallopian tube into the uterus (womb), where it attaches to the wall of the uterus and grows. A man must have enough sperm, and the sperm must join with (fertilize) the egg along the way, at the proper time. The fertilized egg must then become attached to the inside of the uterus. While this may seem simple, many things can happen to prevent pregnancy from occurring.  WHOSE PROBLEM IS IT?  About 20% of infertility cases are due to problems with the man (female factors) and 65% are due to problems with the woman (female factors). Other cases are due to a combination of female and female factors or to unknown causes.  WHAT CAUSES INFERTILITY IN MEN?  Infertility in men is often caused by problems with making enough normal sperm or getting the sperm to reach the egg. Problems with sperm may exist from birth or develop later in life, due to illness or injury. Some men produce no sperm, or produce too few sperm (oligospermia). Other problems include:  Sexual dysfunction.  Hormonal or endocrine problems.  Age. Female fertility decreases with age, but not at as young an age as female fertility.  Infection.  Congenital problems. Birth defect, such as absence of the tubes that carry the sperm (vas deferens).  Genetic/chromosomal problems.  Antisperm antibody problems.  Retrograde ejaculation (sperm go into the bladder).  Varicoceles, spematoceles, or tumors of the testicles.  Lifestyle can influence the number and  quality of a man's sperm.  Alcohol and drugs can temporarily reduce sperm quality.  Environmental toxins, including pesticides and lead, may cause some cases of infertility in men. WHAT CAUSES INFERTILITY IN WOMEN?   Problems with ovulation account for most infertility in women. Without ovulation, eggs are not available to be fertilized.  Signs of problems with ovulation include irregular menstrual periods or no periods at all.  Simple lifestyle factors, including stress, diet, or athletic training, can affect a woman's hormonal balance.  Age. Fertility begins to decrease in women in the early 76s and is worse after age 71.  Much less often, a hormonal imbalance from a serious medical problem, such as a pituitary gland tumor, thyroid or other chronic medical disease, can cause ovulation problems.  Pelvic infections.  Polycystic ovary syndrome (increase in female hormones, unable to ovulate).  Alcohol or illegal drugs.  Environmental toxins, radiation, pesticides, and certain chemicals.  Aging is an important factor in female infertility.  The ability of a woman's ovaries to produce eggs declines with age, especially after age 27. About one third of couples where the woman is over 21 will have problems with fertility.  By the time she reaches menopause when her monthly periods stop for good, a woman can no longer produce eggs or become pregnant.  Other problems can also lead to infertility in women. If the fallopian tubes are blocked at one or both ends, the egg cannot travel through the tubes into the uterus. Scar tissue (adhesions) in the pelvis may cause blocked  tubes. This may result from pelvic inflammatory disease, endometriosis, or surgery for an ectopic pregnancy (fertilized egg implanted outside the uterus) or any pelvic or abdominal surgery causing adhesions.  Fibroid tumors or polyps of the uterus.  Congenital (birth defect) abnormalities of the uterus.  Infection of the  cervix (cervicitis).  Cervical stenosis (narrowing).  Abnormal cervical mucus.  Polycystic ovary syndrome.  Having sexual intercourse too often (every other day or 4 to 5 times a week).  Obesity.  Anorexia.  Poor nutrition.  Over exercising, with loss of body fat.  DES. Your mother received diethylstilbesterol hormone when pregnant with you. HOW IS INFERTILITY TESTED?  If you have been trying to have a baby without success, you may want to seek medical help. You should not wait for one year of trying before seeing a health care provider if:  You are over 35.  You have reason to believe that there may be a fertility problem. A medical evaluation may determine the reasons for a couple's infertility. Usually this process begins with:  Physical exams.  Medical histories of both partners.  Sexual histories of both partners. If there is no obvious problem, like improperly timed intercourse or absence of ovulation, tests may be needed.   For a man, testing usually begins with tests of his semen to look at:  The number of sperm.  The shape of sperm.  Movement of his sperm.  Taking a complete medical and surgical history.  Physical examination.  Check for infection of the female reproductive organs. Sometimes hormone tests are done.   For a woman, the first step in testing is to find out if she is ovulating each month. There are several ways to do this. For example, she can keep track of changes in her morning body temperature and in the texture of her cervical mucus. Another tool is a home ovulation test kit, which can be bought at drug or grocery stores.  Checks of ovulation can also be done in the doctor's office, using blood tests for hormone levels or ultrasound tests of the ovaries. If the woman is ovulating, more tests will need to be done. Some common female tests include:  Hysterosalpingogram: An x-ray of the fallopian tubes and uterus after they are injected with  dye. It shows if the tubes are open and shows the shape of the uterus.  Laparoscopy: An exam of the tubes and other female organs for disease. A lighted tube called a laparoscope is used to see inside the abdomen.  Endometrial biopsy: Sample of uterus tissue taken on the first day of the menstrual period, to see if the tissue indicates you are ovulating.  Transvaginal ultrasound: Examines the female organs.  Hysteroscopy: Uses a lighted tube to examine the cervix and inside the uterus, to see if there are any abnormalities inside the uterus. TREATMENT  Depending on the test results, different treatments can be suggested. The type of treatment depends on the cause. 85 to 90% of infertility cases are treated with drugs or surgery.   Various fertility drugs may be used for women with ovulation problems. It is important to talk with your caregiver about the drug to be used. You should understand the drug's benefits and side effects. Depending on the type of fertility drug and the dosage of the drug used, multiple births (twins or multiples) can occur in some women.  If needed, surgery can be done to repair damage to a woman's ovaries, fallopian tubes, cervix, or uterus.  Surgery  or medical treatment for endometriosis or polycystic ovary syndrome. Sometimes a man has an infertility problem that can be corrected with medicine or by surgery.  Intrauterine insemination (IUI) of sperm, timed with ovulation.  Change in lifestyle, if that is the cause (lose weight, increase exercise, and stop smoking, drinking excessively, or taking illegal drugs).  Other types of surgery:  Removing growths inside and on the uterus.  Removing scar tissue from inside of the uterus.  Fixing blocked tubes.  Removing scar tissue in the pelvis and around the female organs. WHAT IS ASSISTED REPRODUCTIVE TECHNOLOGY (ART)?  Assisted reproductive technology (ART) is another form of special methods used to help infertile  couples. ART involves handling both the woman's eggs and the man's sperm. Success rates vary and depend on many factors. ART can be expensive and time-consuming. But ART has made it possible for many couples to have children that otherwise would not have been conceived. Some methods are listed below:  In vitro fertilization (IVF). IVF is often used when a woman's fallopian tubes are blocked or when a man has low sperm counts. A drug is used to stimulate the ovaries to produce multiple eggs. Once mature, the eggs are removed and placed in a culture dish with the man's sperm for fertilization. After about 40 hours, the eggs are examined to see if they have become fertilized by the sperm and are dividing into cells. These fertilized eggs (embryos) are then placed in the woman's uterus. This bypasses the fallopian tubes.  Gamete intrafallopian transfer (GIFT) is similar to IVF, but used when the woman has at least one normal fallopian tube. Three to five eggs are placed in the fallopian tube, along with the man's sperm, for fertilization inside the woman's body.  Zygote intrafallopian transfer (ZIFT), also called tubal embryo transfer, combines IVF and GIFT. The eggs retrieved from the woman's ovaries are fertilized in the lab and placed in the fallopian tubes rather than in the uterus.  ART procedures sometimes involve the use of donor eggs (eggs from another woman) or previously frozen embryos. Donor eggs may be used if a woman has impaired ovaries or has a genetic disease that could be passed on to her baby.  When performing ART, you are at higher risk for resulting in multiple pregnancies, twins, triplets or more.  Intracytoplasma sperm injection is a procedure that injects a single sperm into the egg to fertilize it.  Embryo transplant is a procedure that starts after growing an embryo in a special media (chemical solution) developed to keep the embryo alive for 2 to 5 days, and then transplanting it  into the uterus. In cases where a cause cannot be found and pregnancy does not occur, adoption may be a consideration. Document Released: 07/10/2003 Document Revised: 09/29/2011 Document Reviewed: 06/05/2009 [facility] Patient Information 2015 [place], [place]. This information is not intended to replace advice given to you by your health care provider. Make sure you discuss any questions you have with your health care provider.

## 2014-06-03 LAB — PROLACTIN: Prolactin: 21.9 ng/mL

## 2014-06-03 LAB — TSH: TSH: 1.116 u[IU]/mL (ref 0.350–4.500)

## 2015-10-24 LAB — OB RESULTS CONSOLE GC/CHLAMYDIA: GC PROBE AMP, GENITAL: NEGATIVE

## 2016-03-05 ENCOUNTER — Encounter: Payer: Self-pay | Admitting: Obstetrics and Gynecology

## 2016-03-13 ENCOUNTER — Encounter: Payer: Self-pay | Admitting: Obstetrics and Gynecology

## 2016-03-13 ENCOUNTER — Ambulatory Visit (INDEPENDENT_AMBULATORY_CARE_PROVIDER_SITE_OTHER): Payer: BLUE CROSS/BLUE SHIELD | Admitting: Obstetrics and Gynecology

## 2016-03-13 VITALS — BP 102/60 | HR 80 | Resp 13 | Ht 67.5 in | Wt 134.0 lb

## 2016-03-13 DIAGNOSIS — Z124 Encounter for screening for malignant neoplasm of cervix: Secondary | ICD-10-CM | POA: Diagnosis not present

## 2016-03-13 DIAGNOSIS — Z8619 Personal history of other infectious and parasitic diseases: Secondary | ICD-10-CM

## 2016-03-13 DIAGNOSIS — Z3169 Encounter for other general counseling and advice on procreation: Secondary | ICD-10-CM | POA: Diagnosis not present

## 2016-03-13 DIAGNOSIS — N970 Female infertility associated with anovulation: Secondary | ICD-10-CM

## 2016-03-13 DIAGNOSIS — Z01419 Encounter for gynecological examination (general) (routine) without abnormal findings: Secondary | ICD-10-CM | POA: Diagnosis not present

## 2016-03-13 DIAGNOSIS — Z0000 Encounter for general adult medical examination without abnormal findings: Secondary | ICD-10-CM

## 2016-03-13 DIAGNOSIS — N898 Other specified noninflammatory disorders of vagina: Secondary | ICD-10-CM

## 2016-03-13 DIAGNOSIS — N9489 Other specified conditions associated with female genital organs and menstrual cycle: Secondary | ICD-10-CM

## 2016-03-13 DIAGNOSIS — Z113 Encounter for screening for infections with a predominantly sexual mode of transmission: Secondary | ICD-10-CM | POA: Diagnosis not present

## 2016-03-13 LAB — COMPREHENSIVE METABOLIC PANEL
ALK PHOS: 54 U/L (ref 33–115)
ALT: 11 U/L (ref 6–29)
AST: 17 U/L (ref 10–30)
Albumin: 4.5 g/dL (ref 3.6–5.1)
BUN: 12 mg/dL (ref 7–25)
CO2: 23 mmol/L (ref 20–31)
Calcium: 9.5 mg/dL (ref 8.6–10.2)
Chloride: 103 mmol/L (ref 98–110)
Creat: 0.79 mg/dL (ref 0.50–1.10)
Glucose, Bld: 82 mg/dL (ref 65–99)
Potassium: 4 mmol/L (ref 3.5–5.3)
SODIUM: 138 mmol/L (ref 135–146)
Total Bilirubin: 0.6 mg/dL (ref 0.2–1.2)
Total Protein: 7.6 g/dL (ref 6.1–8.1)

## 2016-03-13 LAB — LIPID PANEL
CHOLESTEROL: 138 mg/dL (ref 125–200)
HDL: 91 mg/dL (ref >=46)
LDL Cholesterol: 39 mg/dL (ref <130)
Total CHOL/HDL Ratio: 1.5 Ratio (ref <=5.0)
Triglycerides: 38 mg/dL (ref <150)
VLDL: 8 mg/dL (ref <30)

## 2016-03-13 LAB — CBC
HCT: 42.7 % (ref 35.0–45.0)
Hemoglobin: 14.3 g/dL (ref 11.7–15.5)
MCH: 30.7 pg (ref 27.0–33.0)
MCHC: 33.5 g/dL (ref 32.0–36.0)
MCV: 91.6 fL (ref 80.0–100.0)
MPV: 11.7 fL (ref 7.5–12.5)
PLATELETS: 206 10*3/uL (ref 140–400)
RBC: 4.66 MIL/uL (ref 3.80–5.10)
RDW: 12.5 % (ref 11.0–15.0)
WBC: 4.8 10*3/uL (ref 3.8–10.8)

## 2016-03-13 NOTE — Progress Notes (Signed)
20 y.o. G0P0 SingleAfrican AmericanF here for annual exam.   Period Cycle (Days): 28 Period Duration (Days): 5-6 days  Period Pattern: Regular Menstrual Flow: Moderate Menstrual Control: Tampon, Maxi pad, Thin pad Dysmenorrhea: (!) Moderate Dysmenorrhea Symptoms: Cramping  Saturates a pad in 3 hours. Rare episodes of spotting prior to her cycle.  Sexually active, same partner x 2 year, no contraception other than with drawl. Considering pregnancy. She has a history of GC and chlamydia, she had negative testing for infertility. Reports a normal HSG in the past. Most of the time she is not using any contraception. She doesn't want contraception. She would be okay if she got pregnant. She states they have really not using contraception in a year (of any kind).  She c/o a vaginal odor, mild, no increase in discharge. She has had BV in the past, thinks she may have it again.   Patient's last menstrual period was 03/05/2016.          Sexually active: Yes.    The current method of family planning is none.    Exercising: No.  The patient does not participate in regular exercise at present. Smoker:  no  Health Maintenance: Pap:  Never History of abnormal Pap:  N/A TDaP:  unsure Gardasil: completed all 3    reports that she has never smoked. She has never used smokeless tobacco. She reports that she does not drink alcohol or use drugs.  Past Medical History:  Diagnosis Date  . Chlamydia     No past surgical history on file.  No current outpatient prescriptions on file.   No current facility-administered medications for this visit.     No family history on file.  Review of Systems  Constitutional: Negative.   HENT: Negative.   Eyes: Negative.   Respiratory: Negative.   Cardiovascular: Negative.   Gastrointestinal: Negative.   Endocrine: Negative.   Genitourinary: Negative.   Musculoskeletal: Negative.   Skin: Negative.   Allergic/Immunologic: Negative.   Neurological:  Negative.   Psychiatric/Behavioral: Negative.     Exam:   BP 102/60 (BP Location: Right Arm, Patient Position: Sitting, Cuff Size: Normal)   Pulse 80   Resp 13   Ht 5' 7.5" (1.715 m)   Wt 134 lb (60.8 kg)   LMP 03/05/2016   BMI 20.68 kg/m   Weight change: @WEIGHTCHANGE @ Height:   Height: 5' 7.5" (171.5 cm)  Ht Readings from Last 3 Encounters:  03/13/16 5' 7.5" (1.715 m)  05/04/14 5\' 6"  (1.676 m)  02/08/14 5\' 7"  (1.702 m)    General appearance: alert, cooperative and appears stated age Head: Normocephalic, without obvious abnormality, atraumatic Neck: no adenopathy, supple, symmetrical, trachea midline and thyroid normal to inspection and palpation Lungs: clear to auscultation bilaterally Breasts: normal appearance, no masses or tenderness Heart: regular rate and rhythm Abdomen: soft, non-tender; bowel sounds normal; no masses,  no organomegaly Extremities: extremities normal, atraumatic, no cyanosis or edema Skin: Skin color, texture, turgor normal. No rashes or lesions Lymph nodes: Cervical, supraclavicular, and axillary nodes normal. No abnormal inguinal nodes palpated Neurologic: Grossly normal   Pelvic: External genitalia:  no lesions              Urethra:  normal appearing urethra with no masses, tenderness or lesions              Bartholins and Skenes: normal                 Vagina: normal appearing vagina with normal  color and discharge, no lesions              Cervix: no lesions               Bimanual Exam:  Uterus:  normal size, contour, position, consistency, mobility, non-tender              Adnexa: no mass, fullness, tenderness               Rectovaginal: Confirms               Anus:  normal sphincter tone, no lesions  Chaperone was present for exam.  A:  Well Woman with normal exam  Unprotected intercourse for over a year. She has a h/o STD's, reports a normal HSG  Infertility  Vaginal odor    P:   Pap  GC/CT  Start PNV  STD testing,  Rubella  Lipid, CBC, CMP  We discussed need for early evaluation if she is pregnant to r/o ectopic  Copy of her infertility w/u  Semen analysis  Wet prep probe

## 2016-03-13 NOTE — Patient Instructions (Signed)

## 2016-03-14 ENCOUNTER — Telehealth: Payer: Self-pay

## 2016-03-14 LAB — HEPATITIS C ANTIBODY: HCV AB: NEGATIVE

## 2016-03-14 LAB — RUBELLA SCREEN: RUBELLA: 5.43 {index} — AB (ref <0.90)

## 2016-03-14 LAB — STD PANEL
HIV 1&2 Ab, 4th Generation: NONREACTIVE
Hepatitis B Surface Ag: NEGATIVE

## 2016-03-14 LAB — WET PREP BY MOLECULAR PROBE
Candida species: NEGATIVE
GARDNERELLA VAGINALIS: POSITIVE — AB
Trichomonas vaginosis: NEGATIVE

## 2016-03-14 LAB — VITAMIN D 25 HYDROXY (VIT D DEFICIENCY, FRACTURES): Vit D, 25-Hydroxy: 31 ng/mL (ref 30–100)

## 2016-03-14 MED ORDER — METRONIDAZOLE 0.75 % VA GEL: 1.0000 | Freq: Every day | VAGINAL | 0 refills | Status: DC

## 2016-03-14 NOTE — Telephone Encounter (Signed)
Spoke with patient. Advised of results as seen below from Dr.Jertson. She is agreeable and verbalizes understanding. Would like to use Metrogel for treatment at this time. Rx for Metrogel, 1 applicator per vagina q day x 5 days 0RF sent to pharmacy on file. Patient is agreeable and verbalizes understanding.  Routing to provider for final review. Patient agreeable to disposition. Will close encounter.

## 2016-03-14 NOTE — Telephone Encounter (Signed)
-----   Message from Romualdo BolkJill Evelyn Jertson, MD sent at 03/14/2016 10:04 AM EDT ----- Please inform the patient that her vaginitis probe was + for BV and treat with flagyl (either oral or vaginal, her choice), no ETOH while on Flagyl.  Oral: Flagyl 500 mg BID x 7 days, or Vaginal: Metrogel, 1 applicator per vagina q day x 5 days. Her rubella screen pap and genprobe are pending. The rest of her blood work was normal.

## 2016-03-16 LAB — IPS PAP SMEAR ONLY

## 2016-03-17 LAB — IPS N GONORRHOEA AND CHLAMYDIA BY PCR

## 2016-06-04 ENCOUNTER — Encounter: Payer: Self-pay | Admitting: Certified Nurse Midwife

## 2016-06-04 ENCOUNTER — Ambulatory Visit (INDEPENDENT_AMBULATORY_CARE_PROVIDER_SITE_OTHER): Payer: Medicaid Other | Admitting: Certified Nurse Midwife

## 2016-06-04 VITALS — BP 112/73 | HR 87 | Temp 98.2°F | Wt 140.0 lb

## 2016-06-04 DIAGNOSIS — O0932 Supervision of pregnancy with insufficient antenatal care, second trimester: Secondary | ICD-10-CM

## 2016-06-04 DIAGNOSIS — Z3402 Encounter for supervision of normal first pregnancy, second trimester: Secondary | ICD-10-CM

## 2016-06-04 DIAGNOSIS — Z3400 Encounter for supervision of normal first pregnancy, unspecified trimester: Secondary | ICD-10-CM | POA: Insufficient documentation

## 2016-06-04 MED ORDER — OB COMPLETE PETITE 35-5-1-200 MG PO CAPS: 1.0000 | ORAL_CAPSULE | Freq: Every day | ORAL | 12 refills | Status: DC

## 2016-06-04 NOTE — Addendum Note (Signed)
Addended by: Marya LandryFOSTER, Aaban Griep D on: 06/04/2016 03:08 PM   Modules accepted: Orders

## 2016-06-04 NOTE — Progress Notes (Signed)
Subjective:    Kathleen Zhang is being seen today for her first obstetrical visit.  This is a planned pregnancy. She is at [redacted]w[redacted]d gestation. Her obstetrical history is significant for infertility. Relationship with FOB: significant other, living together. Patient does intend to breast feed. Pregnancy history fully reviewed. Currently employed as an aid.    The information documented in the HPI was reviewed and verified.  Menstrual History: OB History    Gravida Para Term Preterm AB Living   1             SAB TAB Ectopic Multiple Live Births                  Menarche age: 20 years of age roughly.   Patient's last menstrual period was 02/15/2016 (exact date).    Past Medical History:  Diagnosis Date  . Chlamydia     No past surgical history on file.   (Not in a hospital admission) No Known Allergies  Social History  Substance Use Topics  . Smoking status: Never Smoker  . Smokeless tobacco: Never Used  . Alcohol use No    No family history on file.   Review of Systems Constitutional: negative for weight loss Gastrointestinal: negative for vomiting Genitourinary:negative for genital lesions and vaginal discharge and dysuria Musculoskeletal:negative for back pain Behavioral/Psych: negative for abusive relationship, depression, illegal drug usage and tobacco use    Objective:    BP 112/73   Pulse 87   Temp 98.2 F (36.8 C)   Wt 140 lb (63.5 kg)   LMP 02/15/2016 (Exact Date)   BMI 21.60 kg/m  General Appearance:    Alert, cooperative, no distress, appears stated age  Head:    Normocephalic, without obvious abnormality, atraumatic  Eyes:    PERRL, conjunctiva/corneas clear, EOM's intact, fundi    benign, both eyes  Ears:    Normal TM's and external ear canals, both ears  Nose:   Nares normal, septum midline, mucosa normal, no drainage    or sinus tenderness  Throat:   Lips, mucosa, and tongue normal; teeth and gums normal  Neck:   Supple, symmetrical, trachea midline, no  adenopathy;    thyroid:  no enlargement/tenderness/nodules; no carotid   bruit or JVD  Back:     Symmetric, no curvature, ROM normal, no CVA tenderness  Lungs:     Clear to auscultation bilaterally, respirations unlabored  Chest Wall:    No tenderness or deformity   Heart:    Regular rate and rhythm, S1 and S2 normal, no murmur, rub   or gallop  Breast Exam:    No tenderness, masses, or nipple abnormality  Abdomen:     Soft, non-tender, bowel sounds active all four quadrants,    no masses, no organomegaly  Genitalia:    Normal female without lesion, discharge or tenderness  Extremities:   Extremities normal, atraumatic, no cyanosis or edema  Pulses:   2+ and symmetric all extremities  Skin:   Skin color, texture, turgor normal, no rashes or lesions  Lymph nodes:   Cervical, supraclavicular, and axillary nodes normal  Neurologic:   CNII-XII intact, normal strength, sensation and reflexes    throughout         Cervix:   Long, thick, closed and posterior.  FHR:   148    Lab Review Urine pregnancy test Labs reviewed no Radiologic studies reviewed no Assessment:    Pregnancy at [redacted]w[redacted]d weeks    Plan:  Prenatal vitamins.  Counseling provided regarding continued use of seat belts, cessation of alcohol consumption, smoking or use of illicit drugs; infection precautions i.e., influenza/TDAP immunizations, toxoplasmosis,CMV, parvovirus, listeria and varicella; workplace safety, exercise during pregnancy; routine dental care, safe medications, sexual activity, hot tubs, saunas, pools, travel, caffeine use, fish and methlymercury, potential toxins, hair treatments, varicose veins Weight gain recommendations per IOM guidelines reviewed: underweight/BMI< 18.5--> gain 28 - 40 lbs; normal weight/BMI 18.5 - 24.9--> gain 25 - 35 lbs; overweight/BMI 25 - 29.9--> gain 15 - 25 lbs; obese/BMI >30->gain  11 - 20 lbs Problem list reviewed and updated. FIRST/CF mutation testing/NIPT/QUAD SCREEN/fragile  X/Ashkenazi Jewish population testing/Spinal muscular atrophy discussed: ordered. Role of ultrasound in pregnancy discussed; fetal survey: ordered. Amniocentesis discussed: not indicated. VBAC calculator score: VBAC consent form provided Meds ordered this encounter  Medications  . Prenat-FeCbn-FeAspGl-FA-Omega (OB COMPLETE PETITE) 35-5-1-200 MG CAPS    Sig: Take 1 tablet by mouth daily.    Dispense:  30 capsule    Refill:  12   Orders Placed This Encounter  Procedures  . Culture, OB Urine  . US OB Comp + 14 Wk    Standing Status:   Future    Standing Expiration Date:   08/04/2017    Order Specific Question:   Reason for Exam (SYMPTOM  OR DIAGNOSIS REQUIRED)    Answer:   fetal anatomy scan    Order Specific Question:   Preferred imaging location?    Answer:   Internal  . Obstetric Panel, Including HIV  . Hemoglobinopathy evaluation  . Varicella zoster antibody, IgG  . ToxASSURE Select 13 (MW), Urine  . MaterniT21 PLUS Core+SCA    Order Specific Question:   Is the patient insulin dependent?    Answer:   No    Order Specific Question:   Please enter gestational age. This should be expressed as weeks AND days, i.e. 16w 6d. Enter weeks here. Enter days in next question.    Answer:   6215    Order Specific Question:   Please enter gestational age. This should be expressed as weeks AND days, i.e. 16w 6d. Enter days here. Enter weeks in previous question.    Answer:   5    Order Specific Question:   How was gestational age calculated?    Answer:   LMP    Order Specific Question:   Please give the date of LMP OR Ultrasound OR Estimated date of delivery.    Answer:   11/21/2016    Order Specific Question:   Number of Fetuses (Type of Pregnancy):    Answer:   1    Order Specific Question:   Indications for performing the test? (please choose all that apply):    Answer:   Routine screening    Order Specific Question:   Other Indications? (Y=Yes, N=No)    Answer:   N    Order Specific  Question:   If this is a repeat specimen, please indicate the reason:    Answer:   Not indicated    Order Specific Question:   Please specify the patient's race: (C=White/Caucasion, B=Black, I=Native American, A=Asian, H=Hispanic, O=Other, U=Unknown)    Answer:   B    Order Specific Question:   Donor Egg - indicate if the egg was obtained from in vitro fertilization.    Answer:   N    Order Specific Question:   Age of Egg Donor.    Answer:   89    Order  Specific Question:   Prior Down Syndrome/ONTD screening during current pregnancy.    Answer:   N    Order Specific Question:   Prior First Trimester Testing    Answer:   N    Comments:   n    Order Specific Question:   Prior Second Trimester Testing    Answer:   N    Order Specific Question:   Family History of Neural Tube Defects    Answer:   N    Order Specific Question:   Prior Pregnancy with Down Syndrome    Answer:   N    Order Specific Question:   Please give the patient's weight (in pounds)    Answer:   140  . Hemoglobin A1c  . Cystic Fibrosis Mutation 97  . TSH  . NuSwab Vaginitis Plus (VG+)    Follow up in 4 weeks. 50% of 30 min visit spent on counseling and coordination of care.

## 2016-06-07 LAB — NUSWAB VAGINITIS PLUS (VG+)
CANDIDA GLABRATA, NAA: NEGATIVE
CHLAMYDIA TRACHOMATIS, NAA: NEGATIVE
Candida albicans, NAA: NEGATIVE
Neisseria gonorrhoeae, NAA: NEGATIVE
TRICH VAG BY NAA: NEGATIVE

## 2016-06-07 LAB — URINE CULTURE, OB REFLEX

## 2016-06-07 LAB — CULTURE, OB URINE

## 2016-06-10 LAB — SPECIMEN STATUS REPORT

## 2016-06-10 LAB — TOXASSURE SELECT 13 (MW), URINE

## 2016-06-11 ENCOUNTER — Other Ambulatory Visit: Payer: Self-pay | Admitting: Certified Nurse Midwife

## 2016-06-11 DIAGNOSIS — Z3402 Encounter for supervision of normal first pregnancy, second trimester: Secondary | ICD-10-CM

## 2016-06-11 LAB — MATERNIT21 PLUS CORE+SCA
CHROMOSOME 18: NEGATIVE
Chromosome 13: NEGATIVE
Chromosome 21: NEGATIVE
Y CHROMOSOME: NOT DETECTED

## 2016-06-11 LAB — HEMOGLOBIN A1C
ESTIMATED AVERAGE GLUCOSE: 103 mg/dL
Hgb A1c MFr Bld: 5.2 % (ref 4.8–5.6)

## 2016-06-11 LAB — CYSTIC FIBROSIS MUTATION 97: GENE DIS ANAL CARRIER INTERP BLD/T-IMP: NOT DETECTED

## 2016-06-11 LAB — TSH: TSH: 1.06 u[IU]/mL (ref 0.450–4.500)

## 2016-06-24 ENCOUNTER — Other Ambulatory Visit: Payer: Medicaid Other

## 2016-06-24 ENCOUNTER — Ambulatory Visit (HOSPITAL_COMMUNITY)
Admission: RE | Admit: 2016-06-24 | Discharge: 2016-06-24 | Disposition: A | Discharge status: Home / Self Care | Payer: Medicaid Other | Source: Ambulatory Visit | Attending: Certified Nurse Midwife | Admitting: Certified Nurse Midwife

## 2016-06-24 ENCOUNTER — Other Ambulatory Visit: Payer: Self-pay | Admitting: Certified Nurse Midwife

## 2016-06-24 DIAGNOSIS — Z3A18 18 weeks gestation of pregnancy: Secondary | ICD-10-CM | POA: Diagnosis not present

## 2016-06-24 DIAGNOSIS — Z363 Encounter for antenatal screening for malformations: Secondary | ICD-10-CM | POA: Diagnosis not present

## 2016-06-24 DIAGNOSIS — O0932 Supervision of pregnancy with insufficient antenatal care, second trimester: Secondary | ICD-10-CM

## 2016-06-24 DIAGNOSIS — Z3402 Encounter for supervision of normal first pregnancy, second trimester: Secondary | ICD-10-CM

## 2016-06-24 NOTE — Addendum Note (Signed)
Addended by: Maretta BeesMCGLASHAN, Avenir Lozinski J on: 06/24/2016 11:02 AM   Modules accepted: Orders

## 2016-06-25 ENCOUNTER — Other Ambulatory Visit: Payer: Self-pay | Admitting: Certified Nurse Midwife

## 2016-06-25 DIAGNOSIS — Z3402 Encounter for supervision of normal first pregnancy, second trimester: Secondary | ICD-10-CM

## 2016-07-02 ENCOUNTER — Ambulatory Visit (INDEPENDENT_AMBULATORY_CARE_PROVIDER_SITE_OTHER): Payer: Medicaid Other | Admitting: Obstetrics & Gynecology

## 2016-07-02 VITALS — BP 130/85 | HR 89 | Wt 144.0 lb

## 2016-07-02 DIAGNOSIS — O0932 Supervision of pregnancy with insufficient antenatal care, second trimester: Secondary | ICD-10-CM

## 2016-07-02 DIAGNOSIS — Z3402 Encounter for supervision of normal first pregnancy, second trimester: Secondary | ICD-10-CM

## 2016-07-02 NOTE — Progress Notes (Signed)
   PRENATAL VISIT NOTE  Subjective:  Kathleen Zhang is a 20 y.o. G1P0 at 4668w5d being seen today for ongoing prenatal care.  She is currently monitored for the following issues for this low-risk pregnancy and has Pharyngitis; Gonorrhea; Infertility, female, primary; Exposure to sexually transmitted disease (STD); Supervision of normal first pregnancy; and Late prenatal care affecting pregnancy in second trimester on her problem list.  Patient reports no complaints.  Contractions: Not present. Vag. Bleeding: None.  Movement: Present. Denies leaking of fluid.   The following portions of the patient's history were reviewed and updated as appropriate: allergies, current medications, past family history, past medical history, past social history, past surgical history and problem list. Problem list updated.  Objective:   Vitals:   07/02/16 0954  BP: 130/85  Pulse: 89  Weight: 144 lb (65.3 kg)    Fetal Status:     Movement: Present     General:  Alert, oriented and cooperative. Patient is in no acute distress.  Skin: Skin is warm and dry. No rash noted.   Cardiovascular: Normal heart rate noted  Respiratory: Normal respiratory effort, no problems with respiration noted  Abdomen: Soft, gravid, appropriate for gestational age. Pain/Pressure: Absent     Pelvic:  Cervical exam deferred        Extremities: Normal range of motion.     Mental Status: Normal mood and affect. Normal behavior. Normal judgment and thought content.   Assessment and Plan:  Pregnancy: G1P0 at 4568w5d  1. Late prenatal care affecting pregnancy in second trimester   2. Encounter for supervision of normal first pregnancy in second trimester  - AFP, Quad Screen  Preterm labor symptoms and general obstetric precautions including but not limited to vaginal bleeding, contractions, leaking of fluid and fetal movement were reviewed in detail with the patient. Please refer to After Visit Summary for other counseling  recommendations.  No Follow-up on file.   Allie BossierMyra C Eufemio Strahm, MD

## 2016-07-02 NOTE — Progress Notes (Signed)
Pt states some mild cramping. 

## 2016-07-04 LAB — AFP, SERUM, OPEN SPINA BIFIDA
AFP MOM: 0.88
AFP VALUE AFPOSL: 53.8 ng/mL
Gest. Age on Collection Date: 19.7 weeks
Maternal Age At EDD: 22.9 years
OSBR Risk 1 IN: 10000
Test Results:: NEGATIVE
Weight: 144 [lb_av]

## 2016-07-21 NOTE — L&D Delivery Note (Signed)
Delivery Note At 10:11 PM a viable female was delivered via Vaginal, Spontaneous Delivery (Presentation LOA ).  APGAR: 7, 9; weight pending .   Placenta status: Delivered intact with gentle traction.  Cord: 3 vessels with the following complications: None. Cord pH: not collected  Anesthesia: no epidural, IV pain meds Episiotomy: None Lacerations:  1st degree perineal, no repair needed. Suture Repair: N/A Est. Blood Loss (mL): 100  Mom to postpartum.  Baby to Couplet care / Skin to Skin.  Lovena Neighbours, PGY-1 11/10/2016, 10:42 PM

## 2016-07-22 ENCOUNTER — Ambulatory Visit (HOSPITAL_COMMUNITY)
Admission: RE | Admit: 2016-07-22 | Discharge: 2016-07-22 | Disposition: A | Discharge status: Home / Self Care | Payer: Medicaid Other | Source: Ambulatory Visit | Attending: Certified Nurse Midwife | Admitting: Certified Nurse Midwife

## 2016-07-22 ENCOUNTER — Other Ambulatory Visit: Payer: Self-pay | Admitting: Certified Nurse Midwife

## 2016-07-22 ENCOUNTER — Ambulatory Visit (HOSPITAL_COMMUNITY): Payer: Medicaid Other

## 2016-07-22 DIAGNOSIS — Z362 Encounter for other antenatal screening follow-up: Secondary | ICD-10-CM

## 2016-07-22 DIAGNOSIS — Z3402 Encounter for supervision of normal first pregnancy, second trimester: Secondary | ICD-10-CM

## 2016-07-22 DIAGNOSIS — Z3A22 22 weeks gestation of pregnancy: Secondary | ICD-10-CM | POA: Diagnosis not present

## 2016-07-24 ENCOUNTER — Inpatient Hospital Stay (HOSPITAL_COMMUNITY)
Admission: AD | Admit: 2016-07-24 | Discharge: 2016-07-24 | Disposition: A | Discharge status: Home / Self Care | Payer: Medicaid Other | Source: Ambulatory Visit | Attending: Obstetrics & Gynecology | Admitting: Obstetrics & Gynecology

## 2016-07-24 ENCOUNTER — Encounter (HOSPITAL_COMMUNITY): Payer: Self-pay

## 2016-07-24 DIAGNOSIS — O99512 Diseases of the respiratory system complicating pregnancy, second trimester: Secondary | ICD-10-CM | POA: Diagnosis not present

## 2016-07-24 DIAGNOSIS — Z3A22 22 weeks gestation of pregnancy: Secondary | ICD-10-CM | POA: Insufficient documentation

## 2016-07-24 DIAGNOSIS — B9789 Other viral agents as the cause of diseases classified elsewhere: Secondary | ICD-10-CM | POA: Diagnosis not present

## 2016-07-24 DIAGNOSIS — J069 Acute upper respiratory infection, unspecified: Secondary | ICD-10-CM | POA: Diagnosis not present

## 2016-07-24 DIAGNOSIS — R05 Cough: Secondary | ICD-10-CM | POA: Diagnosis present

## 2016-07-24 HISTORY — DX: Other specified health status: Z78.9

## 2016-07-24 LAB — URINALYSIS, ROUTINE W REFLEX MICROSCOPIC
Bilirubin Urine: NEGATIVE
Glucose, UA: NEGATIVE mg/dL
HGB URINE DIPSTICK: NEGATIVE
Ketones, ur: NEGATIVE mg/dL
Leukocytes, UA: NEGATIVE
NITRITE: NEGATIVE
Protein, ur: 30 mg/dL — AB
SPECIFIC GRAVITY, URINE: 1.023 (ref 1.005–1.030)
pH: 7 (ref 5.0–8.0)

## 2016-07-24 MED ORDER — BENZONATATE 100 MG PO CAPS: 100.0000 mg | ORAL_CAPSULE | Freq: Three times a day (TID) | ORAL | 0 refills | Status: DC | PRN

## 2016-07-24 NOTE — Discharge Instructions (Signed)
Upper Respiratory Infection, Adult Most upper respiratory infections (URIs) are caused by a virus. A URI affects the nose, throat, and upper air passages. The most common type of URI is often called "the common cold." Follow these instructions at home:  Take medicines only as told by your doctor.  Gargle warm saltwater or take cough drops to comfort your throat as told by your doctor.  Use a warm mist humidifier or inhale steam from a shower to increase air moisture. This may make it easier to breathe.  Drink enough fluid to keep your pee (urine) clear or pale yellow.  Eat soups and other clear broths.  Have a healthy diet.  Rest as needed.  Go back to work when your fever is gone or your doctor says it is okay.  You may need to stay home longer to avoid giving your URI to others.  You can also wear a face mask and wash your hands often to prevent spread of the virus.  Use your inhaler more if you have asthma.  Do not use any tobacco products, including cigarettes, chewing tobacco, or electronic cigarettes. If you need help quitting, ask your doctor. Contact a doctor if:  You are getting worse, not better.  Your symptoms are not helped by medicine.  You have chills.  You are getting more short of breath.  You have brown or red mucus.  You have yellow or brown discharge from your nose.  You have pain in your face, especially when you bend forward.  You have a fever.  You have puffy (swollen) neck glands.  You have pain while swallowing.  You have white areas in the back of your throat. Get help right away if:  You have very bad or constant:  Headache.  Ear pain.  Pain in your forehead, behind your eyes, and over your cheekbones (sinus pain).  Chest pain.  You have long-lasting (chronic) lung disease and any of the following:  Wheezing.  Long-lasting cough.  Coughing up blood.  A change in your usual mucus.  You have a stiff neck.  You have  changes in your:  Vision.  Hearing.  Thinking.  Mood. This information is not intended to replace advice given to you by your health care provider. Make sure you discuss any questions you have with your health care provider. Document Released: 12/24/2007 Document Revised: 03/09/2016 Document Reviewed: 10/12/2013 Elsevier Interactive Patient Education  2017 Elsevier Inc.  

## 2016-07-24 NOTE — MAU Note (Signed)
Pt presents to MAU with coughing and sneezing that has been going on since Saturday. She has taken mucinex but has not found relief. Noticed some blood with her mucous when she was coughing last night. Has not seen any blood this morning.

## 2016-07-24 NOTE — MAU Provider Note (Signed)
History     CSN: [id number]  Arrival date and time: 07/24/16 1210   First Provider Initiated Contact with Patient 07/24/16 1304      Chief Complaint  Patient presents with  . Cough   HPI [name] is a 21 y.o. G1P0 at 4718w6d who presents to MAU today with complaint of cough, sore throat and nasal congestion since Saturday. She also states intermittent mild headache. She has tried Mucinex without relief. She denies abdominal pain, contractions, vaginal bleeding, LOF, fever, chills or sick contacts. She reports good fetal movement.   OB History    Gravida Para Term Preterm AB Living   1             SAB TAB Ectopic Multiple Live Births                  Past Medical History:  Diagnosis Date  . Chlamydia   . Medical history non-contributory     Past Surgical History:  Procedure Laterality Date  . NO PAST SURGERIES      No family history on file.  Social History  Substance Use Topics  . Smoking status: Never Smoker  . Smokeless tobacco: Never Used  . Alcohol use No    Allergies: No Known Allergies  Prescriptions Prior to Admission  Medication Sig Dispense Refill Last Dose  . Prenat-FeCbn-FeAspGl-FA-Omega (OB COMPLETE PETITE) 35-5-1-200 MG CAPS Take 1 tablet by mouth daily. 30 capsule 12 Taking    Review of Systems  Constitutional: Negative for chills and fever.  HENT: Positive for congestion, ear pain, rhinorrhea and sore throat.   Respiratory: Positive for cough. Negative for shortness of breath and wheezing.   Gastrointestinal: Negative for abdominal pain, diarrhea, nausea and vomiting.  Genitourinary: Negative for vaginal bleeding and vaginal discharge.  Neurological: Positive for headaches.   Physical Exam   Blood pressure 120/78, pulse 91, temperature 98.2 F (36.8 C), resp. rate 18, last menstrual period 02/15/2016.  Physical Exam  Constitutional: She is oriented to person, place, and time. She appears well-developed and well-nourished. No  distress.  HENT:  Head: Normocephalic and atraumatic.  Right Ear: Tympanic membrane, external ear and ear canal normal.  Left Ear: Tympanic membrane, external ear and ear canal normal.  Nose: Rhinorrhea present. No mucosal edema. Right sinus exhibits no maxillary sinus tenderness and no frontal sinus tenderness. Left sinus exhibits no maxillary sinus tenderness and no frontal sinus tenderness.  Mouth/Throat: Mucous membranes are normal. Posterior oropharyngeal erythema present. No oropharyngeal exudate, posterior oropharyngeal edema or tonsillar abscesses.  Eyes: EOM are normal.  Neck: Normal range of motion.  Cardiovascular: Normal rate, regular rhythm and normal heart sounds.   Respiratory: Effort normal and breath sounds normal. No respiratory distress. She has no wheezes.  GI: Soft.  Neurological: She is alert and oriented to person, place, and time.  Skin: Skin is warm and dry. No erythema.  Psychiatric: She has a normal mood and affect.    Results for orders placed or performed during the hospital encounter of 07/24/16 (from the past 24 hour(s))  Urinalysis, Routine w reflex microscopic     Status: Abnormal   Collection Time: 07/24/16 12:21 PM  Result Value Ref Range   Color, Urine AMBER (A) YELLOW   APPearance CLOUDY (A) CLEAR   Specific Gravity, Urine 1.023 1.005 - 1.030   pH 7.0 5.0 - 8.0   Glucose, UA NEGATIVE NEGATIVE mg/dL   Hgb urine dipstick NEGATIVE NEGATIVE   Bilirubin Urine NEGATIVE NEGATIVE  Ketones, ur NEGATIVE NEGATIVE mg/dL   Protein, ur 30 (A) NEGATIVE mg/dL   Nitrite NEGATIVE NEGATIVE   Leukocytes, UA NEGATIVE NEGATIVE   RBC / HPF 0-5 0 - 5 RBC/hpf   WBC, UA 0-5 0 - 5 WBC/hpf   Bacteria, UA FEW (A) NONE SEEN   Squamous Epithelial / LPF 0-5 (A) NONE SEEN   Mucous PRESENT     MAU Course  Procedures None  MDM FHR - 148 bpm with doppler UA today   Assessment and Plan  A: [facility] at [redacted]w[redacted]d Viral URI  P: Discharge home Rx for Tessalon Pearles sent  to patient's pharmacy List of OTC medications safe in pregnancy for symptomatic relief given  Advised increased PO hydration and rest Warning signs for worsening condition discussed Patient advised to follow-up with [facility] as scheduled for routine prenatal care or sooner PRN Patient may return to MAU as needed or if her condition were to change or worsen   Marny Lowenstein, PA-C  07/24/2016, 1:13 PM

## 2016-07-25 ENCOUNTER — Other Ambulatory Visit: Payer: Self-pay | Admitting: Certified Nurse Midwife

## 2016-07-25 DIAGNOSIS — Z3402 Encounter for supervision of normal first pregnancy, second trimester: Secondary | ICD-10-CM

## 2016-07-30 ENCOUNTER — Encounter: Payer: Self-pay | Admitting: Certified Nurse Midwife

## 2016-07-30 ENCOUNTER — Ambulatory Visit (INDEPENDENT_AMBULATORY_CARE_PROVIDER_SITE_OTHER): Payer: Medicaid Other | Admitting: Certified Nurse Midwife

## 2016-07-30 VITALS — BP 129/81 | HR 99 | Temp 97.6°F | Wt 155.9 lb

## 2016-07-30 DIAGNOSIS — A549 Gonococcal infection, unspecified: Secondary | ICD-10-CM

## 2016-07-30 DIAGNOSIS — Z202 Contact with and (suspected) exposure to infections with a predominantly sexual mode of transmission: Secondary | ICD-10-CM

## 2016-07-30 DIAGNOSIS — O0932 Supervision of pregnancy with insufficient antenatal care, second trimester: Secondary | ICD-10-CM

## 2016-07-30 DIAGNOSIS — J069 Acute upper respiratory infection, unspecified: Secondary | ICD-10-CM | POA: Diagnosis not present

## 2016-07-30 DIAGNOSIS — Z3402 Encounter for supervision of normal first pregnancy, second trimester: Secondary | ICD-10-CM

## 2016-07-30 LAB — POCT RAPID STREP A (OFFICE): RAPID STREP A SCREEN: NEGATIVE

## 2016-07-30 NOTE — Progress Notes (Signed)
   PRENATAL VISIT NOTE  Subjective:  Kathleen Zhang is a 21 y.o. G1P0 at 252w5d being seen today for ongoing prenatal care.  She is currently monitored for the following issues for this low-risk pregnancy and has Supervision of normal first pregnancy and Late prenatal care affecting pregnancy in second trimester on her problem list.  Patient reports .uri. URI symptoms, denies fever, reports nasal congestion, cough, no sputum production  Contractions: Not present. Vag. Bleeding: None.  Movement: Present. Denies leaking of fluid.   The following portions of the patient's history were reviewed and updated as appropriate: allergies, current medications, past family history, past medical history, past social history, past surgical history and problem list. Problem list updated.  Objective:   Vitals:   07/30/16 1102  BP: 129/81  Pulse: 99  Temp: 97.6 F (36.4 C)  Weight: 155 lb 14.4 oz (70.7 kg)    Fetal Status: Fetal Heart Rate (bpm): 146 Fundal Height: 23 cm Movement: Present     General:  Alert, oriented and cooperative. Patient is in no acute distress.  Skin: Skin is warm and dry. No rash noted.   Cardiovascular: Normal heart rate noted  Respiratory: Normal respiratory effort, no problems with respiration noted  Abdomen: Soft, gravid, appropriate for gestational age. Pain/Pressure: Absent     Pelvic:  Cervical exam deferred        Extremities: Normal range of motion.  Edema: None  Mental Status: Normal mood and affect. Normal behavior. Normal judgment and thought content.   Strep: negative  Assessment and Plan:  Pregnancy: G1P0 at 812w5d  1. Encounter for supervision of normal first pregnancy in second trimester  F/U US scheduled for growth/anatomy 08/19/16.     Acute URI: flu and strep swabs obtained.  Was seen in MAU on 07/24/16 for this.    Preterm labor symptoms and general obstetric precautions including but not limited to vaginal bleeding, contractions, leaking of fluid and fetal  movement were reviewed in detail with the patient. Please refer to After Visit Summary for other counseling recommendations.  Return in about 4 weeks (around 08/27/2016) for ROB/2hour OGTT.   Roe Coombsachelle A Xavion Muscat, CNM

## 2016-08-01 LAB — PLEASE NOTE:

## 2016-08-01 LAB — INFLUENZA A AND B
Influenza A Ag, EIA: NEGATIVE
Influenza B Ag, EIA: NEGATIVE

## 2016-08-14 ENCOUNTER — Inpatient Hospital Stay (HOSPITAL_COMMUNITY)
Admission: AD | Admit: 2016-08-14 | Discharge: 2016-08-14 | Disposition: A | Discharge status: Home / Self Care | Payer: Medicaid Other | Source: Ambulatory Visit | Attending: Obstetrics & Gynecology | Admitting: Obstetrics & Gynecology

## 2016-08-14 ENCOUNTER — Encounter (HOSPITAL_COMMUNITY): Payer: Self-pay

## 2016-08-14 ENCOUNTER — Inpatient Hospital Stay (HOSPITAL_COMMUNITY): Payer: Medicaid Other

## 2016-08-14 DIAGNOSIS — Z3A25 25 weeks gestation of pregnancy: Secondary | ICD-10-CM | POA: Diagnosis not present

## 2016-08-14 DIAGNOSIS — N939 Abnormal uterine and vaginal bleeding, unspecified: Secondary | ICD-10-CM | POA: Diagnosis present

## 2016-08-14 DIAGNOSIS — O4692 Antepartum hemorrhage, unspecified, second trimester: Secondary | ICD-10-CM | POA: Insufficient documentation

## 2016-08-14 NOTE — Discharge Instructions (Signed)
Vaginal Bleeding During Pregnancy, Second Trimester °A small amount of bleeding (spotting) from the vagina is common in pregnancy. Sometimes the bleeding is normal and is not a problem, and sometimes it is a sign of something serious. Be sure to tell your doctor about any bleeding from your vagina right away. °Follow these instructions at home: °· Watch your condition for any changes. °· Follow your doctor's instructions about how active you can be. °· If you are on bed rest: °¨ You may need to stay in bed and only get up to use the bathroom. °¨ You may be allowed to do some activities. °¨ If you need help, make plans for someone to help you. °· Write down: °¨ The number of pads you use each day. °¨ How often you change pads. °¨ How soaked (saturated) your pads are. °· Do not use tampons. °· Do not douche. °· Do not have sex or orgasms until your doctor says it is okay. °· If you pass any tissue from your vagina, save the tissue so you can show it to your doctor. °· Only take medicines as told by your doctor. °· Do not take aspirin because it can make you bleed. °· Do not exercise, lift heavy weights, or do any activities that take a lot of energy and effort unless your doctor says it is okay. °· Keep all follow-up visits as told by your doctor. °Contact a doctor if: °· You bleed from your vagina. °· You have cramps. °· You have labor pains. °· You have a fever that does not go away after you take medicine. °Get help right away if: °· You have very bad cramps in your back or belly (abdomen). °· You have contractions. °· You have chills. °· You pass large clots or tissue from your vagina. °· You bleed more. °· You feel light-headed or weak. °· You pass out (faint). °· You are leaking fluid or have a gush of fluid from your vagina. °This information is not intended to replace advice given to you by your health care provider. Make sure you discuss any questions you have with your health care provider. °Document  Released: 11/21/2013 Document Revised: 12/13/2015 Document Reviewed: 03/14/2013 °Elsevier Interactive Patient Education © 2017 [employer] Inc. ° °

## 2016-08-14 NOTE — MAU Provider Note (Signed)
Chief Complaint:  Vaginal bleeding  HPI: Kathleen Zhang is a 21 y.o. G1P0 at [redacted]w[redacted]d who presents to maternity admissions reporting vaginal bleeding after intercourse. She denies any drug use, cigarette smoking or abdominal trauma and has no history of placental abruption. Denies contractions, leakage of fluid or vaginal bleeding. Good fetal movement.   Pregnancy Course:   Past Medical History: Past Medical History:  Diagnosis Date  . Medical history non-contributory     Past obstetric history: OB History  Gravida Para Term Preterm AB Living  1            SAB TAB Ectopic Multiple Live Births               # Outcome Date GA Lbr Len/2nd Weight Sex Delivery Anes PTL Lv  1 Current               Past Surgical History: Past Surgical History:  Procedure Laterality Date  . NO PAST SURGERIES       Family History: History reviewed. No pertinent family history.  Social History: Social History  Substance Use Topics  . Smoking status: Never Smoker  . Smokeless tobacco: Never Used  . Alcohol use No    Allergies: No Known Allergies  Meds:  Prescriptions Prior to Admission  Medication Sig Dispense Refill Last Dose  . benzonatate (TESSALON) 100 MG capsule Take 1 capsule (100 mg total) by mouth 3 (three) times daily as needed for cough. 20 capsule 0   . Prenat-FeCbn-FeAspGl-FA-Omega (OB COMPLETE PETITE) 35-5-1-200 MG CAPS Take 1 tablet by mouth daily. 30 capsule 12 Taking    I have reviewed patient's Past Medical Hx, Surgical Hx, Family Hx, Social Hx, medications and allergies.   ROS:  A comprehensive ROS was negative except per HPI.    Physical Exam   Patient Vitals for the past 24 hrs:  BP Temp Temp src Pulse Resp SpO2 Height Weight  08/14/16 0148 133/73 98.2 F (36.8 C) Oral 96 15 100 % 5\' 7"  (1.702 m) 75.8 kg (167 lb)   Constitutional: Well-developed, well-nourished female in no acute distress.  Cardiovascular: normal rate Respiratory: normal effort GI: Abd soft,  non-tender, gravid appropriate for gestational age.  MS: Extremities nontender, no edema, normal ROM Neurologic: AAOx3 GU: Neg CVAT.  Pelvic: NEFG, physiologic discharge, small clot in vagina, cervix clean. No active bleeding.      Chaperone present for exam   Labs: No results found for this or any previous visit (from the past 24 hour(s)).  Imaging:  [place] [facility] Ob Follow Up  Result Date: 07/23/2016 OBSTETRICAL ULTRASOUND: This exam was performed within a [facility]. The OB US report was generated in the [facility] system, and faxed to the ordering physician.  This report is available in the [employer]. See the [facility] Obstetric US report via the Image Link.   MAU Course: OB US: preliminary results: no placenta previa or abruption Speculum exam: no active bleeding  MDM: Plan of care reviewed with patient, including labs and tests ordered and medical treatment.   Assessment: 1. Vaginal bleeding in pregnancy, second trimester   Patient presented after noting vaginal bleeding after sexual intercourse.  She denied any abdominal trauma, cigarette use, drug use or abdominal pain.  US showed no placenta previa or abruption.  Speculum exam showed no active bleeding and one small clot in the vagina.  Pelvic rest x 1 week.  Plan: Discharge home in stable condition.  Discussed return precautions with patient.  Preterm  labor precautions and fetal kick counts    Allergies as of 08/14/2016   No Known Allergies     Medication List    TAKE these medications   benzonatate 100 MG capsule Commonly known as:  TESSALON Take 1 capsule (100 mg total) by mouth 3 (three) times daily as needed for cough.   OB COMPLETE PETITE 35-5-1-200 MG Caps Take 1 tablet by mouth daily.       Renne Muscaaniel L Warden, MD PGY-1 08/14/2016 4:47 AM   I have participated in the care of this patient and I agree with the above. FHR 130s, no ctx per Coral Ceotoco. SHAW, KIMBERLY 7:54 AM 08/14/2016

## 2016-08-14 NOTE — MAU Note (Signed)
Pt reports bleeding after sex tonight. Denies pain.

## 2016-08-14 NOTE — MAU Note (Signed)
Notified provider that patient is here for bleeding after intercourse.

## 2016-08-19 ENCOUNTER — Other Ambulatory Visit: Payer: Self-pay | Admitting: Certified Nurse Midwife

## 2016-08-19 ENCOUNTER — Ambulatory Visit (HOSPITAL_COMMUNITY)
Admission: RE | Admit: 2016-08-19 | Discharge: 2016-08-19 | Disposition: A | Discharge status: Home / Self Care | Payer: Medicaid Other | Source: Ambulatory Visit | Attending: Certified Nurse Midwife | Admitting: Certified Nurse Midwife

## 2016-08-19 DIAGNOSIS — Z362 Encounter for other antenatal screening follow-up: Secondary | ICD-10-CM | POA: Insufficient documentation

## 2016-08-19 DIAGNOSIS — Z3402 Encounter for supervision of normal first pregnancy, second trimester: Secondary | ICD-10-CM

## 2016-08-19 DIAGNOSIS — O365920 Maternal care for other known or suspected poor fetal growth, second trimester, not applicable or unspecified: Secondary | ICD-10-CM | POA: Insufficient documentation

## 2016-08-19 DIAGNOSIS — Z3A26 26 weeks gestation of pregnancy: Secondary | ICD-10-CM | POA: Diagnosis not present

## 2016-08-20 ENCOUNTER — Encounter: Payer: Self-pay | Admitting: Family

## 2016-08-20 DIAGNOSIS — O368390 Maternal care for abnormalities of the fetal heart rate or rhythm, unspecified trimester, not applicable or unspecified: Secondary | ICD-10-CM | POA: Insufficient documentation

## 2016-08-27 ENCOUNTER — Other Ambulatory Visit: Payer: Medicaid Other

## 2016-08-27 ENCOUNTER — Ambulatory Visit (INDEPENDENT_AMBULATORY_CARE_PROVIDER_SITE_OTHER): Payer: Medicaid Other | Admitting: Obstetrics & Gynecology

## 2016-08-27 VITALS — BP 123/79 | HR 106 | Wt 167.6 lb

## 2016-08-27 DIAGNOSIS — O368390 Maternal care for abnormalities of the fetal heart rate or rhythm, unspecified trimester, not applicable or unspecified: Secondary | ICD-10-CM

## 2016-08-27 DIAGNOSIS — Z3A27 27 weeks gestation of pregnancy: Secondary | ICD-10-CM

## 2016-08-27 DIAGNOSIS — Z3403 Encounter for supervision of normal first pregnancy, third trimester: Secondary | ICD-10-CM

## 2016-08-27 DIAGNOSIS — O368330 Maternal care for abnormalities of the fetal heart rate or rhythm, third trimester, not applicable or unspecified: Secondary | ICD-10-CM

## 2016-08-27 MED ORDER — TETANUS-DIPHTH-ACELL PERTUSSIS 5-2.5-18.5 LF-MCG/0.5 IM SUSP
0.5000 mL | Freq: Once | INTRAMUSCULAR | Status: AC
  Administered 2016-08-27: 0.5 mL via INTRAMUSCULAR

## 2016-08-27 NOTE — Progress Notes (Signed)
US result from 1/30 discussed   PRENATAL VISIT NOTE  Subjective:  Kathleen Zhang is a 21 y.o. G1P0 at 7024w5d being seen today for ongoing prenatal care.  She is currently monitored for the following issues for this low-risk pregnancy and has Supervision of normal first pregnancy; Late prenatal care affecting pregnancy in second trimester; and Fetal arrhythmia affecting pregnancy, antepartum on her problem list.  Patient reports no complaints.  Contractions: Not present. Vag. Bleeding: None.  Movement: Present. Denies leaking of fluid.   The following portions of the patient's history were reviewed and updated as appropriate: allergies, current medications, past family history, past medical history, past social history, past surgical history and problem list. Problem list updated.  Objective:   Vitals:   08/27/16 0927  BP: 123/79  Pulse: (!) 106  Weight: 167 lb 9.6 oz (76 kg)    Fetal Status: Fetal Heart Rate (bpm): 143   Movement: Present     General:  Alert, oriented and cooperative. Patient is in no acute distress.  Skin: Skin is warm and dry. No rash noted.   Cardiovascular: Normal heart rate noted  Respiratory: Normal respiratory effort, no problems with respiration noted  Abdomen: Soft, gravid, appropriate for gestational age. Pain/Pressure: Absent     Pelvic:  Cervical exam deferred        Extremities: Normal range of motion.  Edema: None  Mental Status: Normal mood and affect. Normal behavior. Normal judgment and thought content.   Assessment and Plan:  Pregnancy: G1P0 at 5624w5d  1. [redacted] weeks gestation of pregnancy F/u fetal growth in 2 weeks - HIV antibody - RPR - CBC - Glucose Tolerance, 2 Hours w/1 Hour - US MFM OB FOLLOW UP; Future  2. Fetal arrhythmia affecting pregnancy, antepartum Check FHR  3. Encounter for supervision of normal first pregnancy in third trimester   Preterm labor symptoms and general obstetric precautions including but not limited to vaginal  bleeding, contractions, leaking of fluid and fetal movement were reviewed in detail with the patient. Please refer to After Visit Summary for other counseling recommendations.  Return in about 2 weeks (around 09/10/2016) for nurse visit 1 week check FHR.   Adam PhenixJames G Arnold, MD

## 2016-08-27 NOTE — Progress Notes (Signed)
Patient states that she feels good today. 

## 2016-08-28 LAB — GLUCOSE TOLERANCE, 2 HOURS W/ 1HR
GLUCOSE, 2 HOUR: 104 mg/dL (ref 65–152)
Glucose, 1 hour: 112 mg/dL (ref 65–179)
Glucose, Fasting: 77 mg/dL (ref 65–91)

## 2016-08-28 LAB — CBC
HEMATOCRIT: 35.7 % (ref 34.0–46.6)
Hemoglobin: 12 g/dL (ref 11.1–15.9)
MCH: 29.9 pg (ref 26.6–33.0)
MCHC: 33.6 g/dL (ref 31.5–35.7)
MCV: 89 fL (ref 79–97)
Platelets: 170 10*3/uL (ref 150–379)
RBC: 4.02 x10E6/uL (ref 3.77–5.28)
RDW: 12.3 % (ref 12.3–15.4)
WBC: 7 10*3/uL (ref 3.4–10.8)

## 2016-08-28 LAB — HIV ANTIBODY (ROUTINE TESTING W REFLEX): HIV Screen 4th Generation wRfx: NONREACTIVE

## 2016-08-28 LAB — RPR: RPR Ser Ql: NONREACTIVE

## 2016-09-03 ENCOUNTER — Other Ambulatory Visit: Payer: Medicaid Other

## 2016-09-03 ENCOUNTER — Ambulatory Visit (INDEPENDENT_AMBULATORY_CARE_PROVIDER_SITE_OTHER): Payer: Medicaid Other | Admitting: Obstetrics and Gynecology

## 2016-09-03 VITALS — BP 133/76 | HR 105 | Wt 171.3 lb

## 2016-09-03 DIAGNOSIS — O368390 Maternal care for abnormalities of the fetal heart rate or rhythm, unspecified trimester, not applicable or unspecified: Secondary | ICD-10-CM

## 2016-09-03 DIAGNOSIS — Z3403 Encounter for supervision of normal first pregnancy, third trimester: Secondary | ICD-10-CM

## 2016-09-03 NOTE — Progress Notes (Signed)
Subjective:  Kathleen Zhang is a [age] y.o. G1P0 at 6311w5d being seen today for ongoing prenatal care.  She is currently monitored for the following issues for this low-risk pregnancy and has Supervision of normal first pregnancy; Late prenatal care affecting pregnancy in second trimester; and Fetal arrhythmia affecting pregnancy, antepartum on her problem list.  Patient reports no complaints.   .  .   . Denies leaking of fluid.   The following portions of the patient's history were reviewed and updated as appropriate: allergies, current medications, past family history, past medical history, past social history, past surgical history and problem list. Problem list updated.  Objective:   Vitals:   09/03/16 0928  BP: 133/76  Pulse: (!) 105  Weight: 171 lb 4.8 oz (77.7 kg)    Fetal Status:           General:  Alert, oriented and cooperative. Patient is in no acute distress.  Skin: Skin is warm and dry. No rash noted.   Cardiovascular: Normal heart rate noted  Respiratory: Normal respiratory effort, no problems with respiration noted  Abdomen: Soft, gravid, appropriate for gestational age.       Pelvic:  Cervical exam deferred        Extremities: Normal range of motion.     Mental Status: Normal mood and affect. Normal behavior. Normal judgment and thought content.   Urinalysis:      Assessment and Plan:  Pregnancy: G1P0 at 6511w5d  1. Fetal arrhythmia affecting pregnancy, antepartum FHT's normal today and no evidence of arrhythmia  2. Encounter for supervision of normal first pregnancy in third trimester   Preterm labor symptoms and general obstetric precautions including but not limited to vaginal bleeding, contractions, leaking of fluid and fetal movement were reviewed in detail with the patient. Please refer to After Visit Summary for other counseling recommendations.  Return in about 2 weeks (around 09/17/2016) for OB visit.   Hermina StaggersMichael L Izaiha Lo, MD

## 2016-09-03 NOTE — Progress Notes (Signed)
Patient is here for Bp Check; Left-133/76 Pulse 105   Right-129/79 Pulse 110

## 2016-09-10 ENCOUNTER — Ambulatory Visit (INDEPENDENT_AMBULATORY_CARE_PROVIDER_SITE_OTHER): Payer: Medicaid Other | Admitting: Obstetrics & Gynecology

## 2016-09-10 VITALS — BP 129/78 | HR 96 | Wt 174.0 lb

## 2016-09-10 DIAGNOSIS — O368390 Maternal care for abnormalities of the fetal heart rate or rhythm, unspecified trimester, not applicable or unspecified: Secondary | ICD-10-CM

## 2016-09-10 DIAGNOSIS — O368330 Maternal care for abnormalities of the fetal heart rate or rhythm, third trimester, not applicable or unspecified: Secondary | ICD-10-CM

## 2016-09-10 DIAGNOSIS — Z3403 Encounter for supervision of normal first pregnancy, third trimester: Secondary | ICD-10-CM

## 2016-09-10 NOTE — Progress Notes (Signed)
   PRENATAL VISIT NOTE  Subjective:  Kathleen Zhang is a 21 y.o. G1P0 at 5946w5d being seen today for ongoing prenatal care.  She is currently monitored for the following issues for this high-risk pregnancy and has Supervision of normal first pregnancy; Late prenatal care affecting pregnancy in second trimester; and Fetal arrhythmia affecting pregnancy, antepartum on her problem list.  Patient reports no complaints.  Contractions: Not present. Vag. Bleeding: None.  Movement: Present. Denies leaking of fluid.   The following portions of the patient's history were reviewed and updated as appropriate: allergies, current medications, past family history, past medical history, past social history, past surgical history and problem list. Problem list updated.  Objective:   Vitals:   09/10/16 0912 09/10/16 0920  BP: 131/78 129/78  Pulse: 97 96  Weight: 174 lb (78.9 kg)     Fetal Status: Fetal Heart Rate (bpm): NST   Movement: Present     General:  Alert, oriented and cooperative. Patient is in no acute distress.  Skin: Skin is warm and dry. No rash noted.   Cardiovascular: Normal heart rate noted  Respiratory: Normal respiratory effort, no problems with respiration noted  Abdomen: Soft, gravid, appropriate for gestational age. Pain/Pressure: Absent     Pelvic:  Cervical exam deferred        Extremities: Normal range of motion.  Edema: None  Mental Status: Normal mood and affect. Normal behavior. Normal judgment and thought content.   Assessment and Plan:  Pregnancy: G1P0 at 1546w5d  1. Encounter for supervision of normal first pregnancy in third trimester Has US f/u this week  2. Fetal arrhythmia affecting pregnancy, antepartum Nl HR no ectopy noted by me  Preterm labor symptoms and general obstetric precautions including but not limited to vaginal bleeding, contractions, leaking of fluid and fetal movement were reviewed in detail with the patient. Please refer to After Visit Summary for  other counseling recommendations.  Return in about 1 week (around 09/17/2016).   Adam PhenixJames G Maizey Menendez, MD

## 2016-09-10 NOTE — Patient Instructions (Signed)
Third Trimester of Pregnancy The third trimester is from week 29 through week 40 (months 7 through 9). The third trimester is a time when the unborn baby (fetus) is growing rapidly. At the end of the ninth month, the fetus is about 20 inches in length and weighs 6-10 pounds. Body changes during your third trimester Your body goes through many changes during pregnancy. The changes vary from woman to woman. During the third trimester:  Your weight will continue to increase. You can expect to gain 25-35 pounds (11-16 kg) by the end of the pregnancy.  You may begin to get stretch marks on your hips, abdomen, and breasts.  You may urinate more often because the fetus is moving lower into your pelvis and pressing on your bladder.  You may develop or continue to have heartburn. This is caused by increased hormones that slow down muscles in the digestive tract.  You may develop or continue to have constipation because increased hormones slow digestion and cause the muscles that push waste through your intestines to relax.  You may develop hemorrhoids. These are swollen veins (varicose veins) in the rectum that can itch or be painful.  You may develop swollen, bulging veins (varicose veins) in your legs.  You may have increased body aches in the pelvis, back, or thighs. This is due to weight gain and increased hormones that are relaxing your joints.  You may have changes in your hair. These can include thickening of your hair, rapid growth, and changes in texture. Some women also have hair loss during or after pregnancy, or hair that feels dry or thin. Your hair will most likely return to normal after your baby is born.  Your breasts will continue to grow and they will continue to become tender. A yellow fluid (colostrum) may leak from your breasts. This is the first milk you are producing for your baby.  Your belly button may stick out.  You may notice more swelling in your hands, face, or  ankles.  You may have increased tingling or numbness in your hands, arms, and legs. The skin on your belly may also feel numb.  You may feel short of breath because of your expanding uterus.  You may have more problems sleeping. This can be caused by the size of your belly, increased need to urinate, and an increase in your body's metabolism.  You may notice the fetus "dropping," or moving lower in your abdomen.  You may have increased vaginal discharge.  Your cervix becomes thin and soft (effaced) near your due date. What to expect at prenatal visits You will have prenatal exams every 2 weeks until week 36. Then you will have weekly prenatal exams. During a routine prenatal visit:  You will be weighed to make sure you and the fetus are growing normally.  Your blood pressure will be taken.  Your abdomen will be measured to track your baby's growth.  The fetal heartbeat will be listened to.  Any test results from the previous visit will be discussed.  You may have a cervical check near your due date to see if you have effaced. At around 36 weeks, your health care provider will check your cervix. At the same time, your health care provider will also perform a test on the secretions of the vaginal tissue. This test is to determine if a type of bacteria, Group B streptococcus, is present. Your health care provider will explain this further. Your health care provider may ask you:    What your birth plan is.  How you are feeling.  If you are feeling the baby move.  If you have had any abnormal symptoms, such as leaking fluid, bleeding, severe headaches, or abdominal cramping.  If you are using any tobacco products, including cigarettes, chewing tobacco, and electronic cigarettes.  If you have any questions. Other tests or screenings that may be performed during your third trimester include:  Blood tests that check for low iron levels (anemia).  Fetal testing to check the health,  activity level, and growth of the fetus. Testing is done if you have certain medical conditions or if there are problems during the pregnancy.  Nonstress test (NST). This test checks the health of your baby to make sure there are no signs of problems, such as the baby not getting enough oxygen. During this test, a belt is placed around your belly. The baby is made to move, and its heart rate is monitored during movement. What is false labor? False labor is a condition in which you feel small, irregular tightenings of the muscles in the womb (contractions) that eventually go away. These are called Braxton Hicks contractions. Contractions may last for hours, days, or even weeks before true labor sets in. If contractions come at regular intervals, become more frequent, increase in intensity, or become painful, you should see your health care provider. What are the signs of labor?  Abdominal cramps.  Regular contractions that start at 10 minutes apart and become stronger and more frequent with time.  Contractions that start on the top of the uterus and spread down to the lower abdomen and back.  Increased pelvic pressure and dull back pain.  A watery or bloody mucus discharge that comes from the vagina.  Leaking of amniotic fluid. This is also known as your "water breaking." It could be a slow trickle or a gush. Let your doctor know if it has a color or strange odor. If you have any of these signs, call your health care provider right away, even if it is before your due date. Follow these instructions at home: Eating and drinking  Continue to eat regular, healthy meals.  Do not eat:  Raw meat or meat spreads.  Unpasteurized milk or cheese.  Unpasteurized juice.  Store-made salad.  Refrigerated smoked seafood.  Hot dogs or deli meat, unless they are piping hot.  More than 6 ounces of albacore tuna a week.  Shark, swordfish, king mackerel, or tile fish.  Store-made salads.  Raw  sprouts, such as mung bean or alfalfa sprouts.  Take prenatal vitamins as told by your health care provider.  Take 1000 mg of calcium daily as told by your health care provider.  If you develop constipation:  Take over-the-counter or prescription medicines.  Drink enough fluid to keep your urine clear or pale yellow.  Eat foods that are high in fiber, such as fresh fruits and vegetables, whole grains, and beans.  Limit foods that are high in fat and processed sugars, such as fried and sweet foods. Activity  Exercise only as directed by your health care provider. Healthy pregnant women should aim for 2 hours and 30 minutes of moderate exercise per week. If you experience any pain or discomfort while exercising, stop.  Avoid heavy lifting.  Do not exercise in extreme heat or humidity, or at high altitudes.  Wear low-heel, comfortable shoes.  Practice good posture.  Do not travel far distances unless it is absolutely necessary and only with the approval   of your health care provider.  Wear your seat belt at all times while in a car, on a bus, or on a plane.  Take frequent breaks and rest with your legs elevated if you have leg cramps or low back pain.  Do not use hot tubs, steam rooms, or saunas.  You may continue to have sex unless your health care provider tells you otherwise. Lifestyle  Do not use any products that contain nicotine or tobacco, such as cigarettes and e-cigarettes. If you need help quitting, ask your health care provider.  Do not drink alcohol.  Do not use any medicinal herbs or unprescribed drugs. These chemicals affect the formation and growth of the baby.  If you develop varicose veins:  Wear support pantyhose or compression stockings as told by your healthcare provider.  Elevate your feet for 15 minutes, 3-4 times a day.  Wear a supportive maternity bra to help with breast tenderness. General instructions  Take over-the-counter and prescription  medicines only as told by your health care provider. There are medicines that are either safe or unsafe to take during pregnancy.  Take warm sitz baths to soothe any pain or discomfort caused by hemorrhoids. Use hemorrhoid cream or witch hazel if your health care provider approves.  Avoid cat litter boxes and soil used by cats. These carry germs that can cause birth defects in the baby. If you have a cat, ask someone to clean the litter box for you.  To prepare for the arrival of your baby:  Take prenatal classes to understand, practice, and ask questions about the labor and delivery.  Make a trial run to the hospital.  Visit the hospital and tour the maternity area.  Arrange for maternity or paternity leave through employers.  Arrange for family and friends to take care of pets while you are in the hospital.  Purchase a rear-facing car seat and make sure you know how to install it in your car.  Pack your hospital bag.  Prepare the baby's nursery. Make sure to remove all pillows and stuffed animals from the baby's crib to prevent suffocation.  Visit your dentist if you have not gone during your pregnancy. Use a soft toothbrush to brush your teeth and be gentle when you floss.  Keep all prenatal follow-up visits as told by your health care provider. This is important. Contact a health care provider if:  You are unsure if you are in labor or if your water has broken.  You become dizzy.  You have mild pelvic cramps, pelvic pressure, or nagging pain in your abdominal area.  You have lower back pain.  You have persistent nausea, vomiting, or diarrhea.  You have an unusual or bad smelling vaginal discharge.  You have pain when you urinate. Get help right away if:  You have a fever.  You are leaking fluid from your vagina.  You have spotting or bleeding from your vagina.  You have severe abdominal pain or cramping.  You have rapid weight loss or weight gain.  You have  shortness of breath with chest pain.  You notice sudden or extreme swelling of your face, hands, ankles, feet, or legs.  Your baby makes fewer than 10 movements in 2 hours.  You have severe headaches that do not go away with medicine.  You have vision changes. Summary  The third trimester is from week 29 through week 40, months 7 through 9. The third trimester is a time when the unborn baby (fetus)   is growing rapidly.  During the third trimester, your discomfort may increase as you and your baby continue to gain weight. You may have abdominal, leg, and back pain, sleeping problems, and an increased need to urinate.  During the third trimester your breasts will keep growing and they will continue to become tender. A yellow fluid (colostrum) may leak from your breasts. This is the first milk you are producing for your baby.  False labor is a condition in which you feel small, irregular tightenings of the muscles in the womb (contractions) that eventually go away. These are called Braxton Hicks contractions. Contractions may last for hours, days, or even weeks before true labor sets in.  Signs of labor can include: abdominal cramps; regular contractions that start at 10 minutes apart and become stronger and more frequent with time; watery or bloody mucus discharge that comes from the vagina; increased pelvic pressure and dull back pain; and leaking of amniotic fluid. This information is not intended to replace advice given to you by your health care provider. Make sure you discuss any questions you have with your health care provider. Document Released: 07/01/2001 Document Revised: 12/13/2015 Document Reviewed: 09/07/2012 [employer] Interactive Patient Education  2017 [employer]  

## 2016-09-12 ENCOUNTER — Other Ambulatory Visit (HOSPITAL_COMMUNITY): Payer: Self-pay | Admitting: *Deleted

## 2016-09-12 ENCOUNTER — Ambulatory Visit (HOSPITAL_COMMUNITY)
Admission: RE | Admit: 2016-09-12 | Discharge: 2016-09-12 | Disposition: A | Discharge status: Home / Self Care | Payer: Medicaid Other | Source: Ambulatory Visit | Attending: Obstetrics & Gynecology | Admitting: Obstetrics & Gynecology

## 2016-09-12 ENCOUNTER — Encounter (HOSPITAL_COMMUNITY): Payer: Self-pay

## 2016-09-12 DIAGNOSIS — O365930 Maternal care for other known or suspected poor fetal growth, third trimester, not applicable or unspecified: Secondary | ICD-10-CM | POA: Insufficient documentation

## 2016-09-12 DIAGNOSIS — Z3A30 30 weeks gestation of pregnancy: Secondary | ICD-10-CM | POA: Diagnosis not present

## 2016-09-12 DIAGNOSIS — Z3A27 27 weeks gestation of pregnancy: Secondary | ICD-10-CM

## 2016-09-12 DIAGNOSIS — O365920 Maternal care for other known or suspected poor fetal growth, second trimester, not applicable or unspecified: Secondary | ICD-10-CM

## 2016-09-17 ENCOUNTER — Ambulatory Visit (INDEPENDENT_AMBULATORY_CARE_PROVIDER_SITE_OTHER): Payer: Medicaid Other | Admitting: Obstetrics and Gynecology

## 2016-09-17 VITALS — BP 129/83 | HR 103 | Wt 178.0 lb

## 2016-09-17 DIAGNOSIS — O368390 Maternal care for abnormalities of the fetal heart rate or rhythm, unspecified trimester, not applicable or unspecified: Secondary | ICD-10-CM

## 2016-09-17 DIAGNOSIS — Z3403 Encounter for supervision of normal first pregnancy, third trimester: Secondary | ICD-10-CM

## 2016-09-17 NOTE — Progress Notes (Signed)
   PRENATAL VISIT NOTE  Subjective:  Kathleen Zhang is a 21 y.o. G1P0 at 4941w5d being seen today for ongoing prenatal care.  She is currently monitored for the following issues for this high-risk pregnancy and has Supervision of normal first pregnancy; Late prenatal care affecting pregnancy in second trimester; and Fetal arrhythmia affecting pregnancy, antepartum on her problem list.  Patient reports no complaints.  Contractions: Not present. Vag. Bleeding: None.  Movement: Present. Denies leaking of fluid.   The following portions of the patient's history were reviewed and updated as appropriate: allergies, current medications, past family history, past medical history, past social history, past surgical history and problem list. Problem list updated.  Objective:   Vitals:   09/17/16 1329 09/17/16 1331  BP: (!) 141/80 129/83  Pulse: (!) 103   Weight: 178 lb (80.7 kg)     Fetal Status: Fetal Heart Rate (bpm): 154   Movement: Present     General:  Alert, oriented and cooperative. Patient is in no acute distress.  Skin: Skin is warm and dry. No rash noted.   Cardiovascular: Normal heart rate noted  Respiratory: Normal respiratory effort, no problems with respiration noted  Abdomen: Soft, gravid, appropriate for gestational age. Pain/Pressure: Absent     Pelvic:  Cervical exam deferred        Extremities: Normal range of motion.  Edema: None  Mental Status: Normal mood and affect. Normal behavior. Normal judgment and thought content.   Assessment and Plan:  Pregnancy: G1P0 at 2141w5d  1. Fetal arrhythmia affecting pregnancy, antepartum No arrhythmia noted on today's visit  2. Encounter for supervision of normal first pregnancy in third trimester Patient is doing well Recent ultrasound reviewed with fetal kidney cyst noted. Answered patient questions Follow up ultrasound in March scheduled  Preterm labor symptoms and general obstetric precautions including but not limited to vaginal  bleeding, contractions, leaking of fluid and fetal movement were reviewed in detail with the patient. Please refer to After Visit Summary for other counseling recommendations.  Return in about 2 weeks (around 10/01/2016) for ROB.   Catalina AntiguaPeggy Matheau [place], MD

## 2016-09-24 ENCOUNTER — Encounter: Payer: Self-pay | Admitting: Obstetrics & Gynecology

## 2016-09-24 ENCOUNTER — Ambulatory Visit (INDEPENDENT_AMBULATORY_CARE_PROVIDER_SITE_OTHER): Payer: Medicaid Other | Admitting: Obstetrics & Gynecology

## 2016-09-24 VITALS — BP 115/73 | HR 91

## 2016-09-24 DIAGNOSIS — O368390 Maternal care for abnormalities of the fetal heart rate or rhythm, unspecified trimester, not applicable or unspecified: Secondary | ICD-10-CM

## 2016-09-24 NOTE — Progress Notes (Signed)
BP 115/73. Pulse 91. FHR 148

## 2016-09-24 NOTE — Patient Instructions (Signed)
Third Trimester of Pregnancy The third trimester is from week 28 through week 40 (months 7 through 9). The third trimester is a time when the unborn baby (fetus) is growing rapidly. At the end of the ninth month, the fetus is about 20 inches in length and weighs 6-10 pounds. Body changes during your third trimester Your body will continue to go through many changes during pregnancy. The changes vary from woman to woman. During the third trimester:  Your weight will continue to increase. You can expect to gain 25-35 pounds (11-16 kg) by the end of the pregnancy.  You may begin to get stretch marks on your hips, abdomen, and breasts.  You may urinate more often because the fetus is moving lower into your pelvis and pressing on your bladder.  You may develop or continue to have heartburn. This is caused by increased hormones that slow down muscles in the digestive tract.  You may develop or continue to have constipation because increased hormones slow digestion and cause the muscles that push waste through your intestines to relax.  You may develop hemorrhoids. These are swollen veins (varicose veins) in the rectum that can itch or be painful.  You may develop swollen, bulging veins (varicose veins) in your legs.  You may have increased body aches in the pelvis, back, or thighs. This is due to weight gain and increased hormones that are relaxing your joints.  You may have changes in your hair. These can include thickening of your hair, rapid growth, and changes in texture. Some women also have hair loss during or after pregnancy, or hair that feels dry or thin. Your hair will most likely return to normal after your baby is born.  Your breasts will continue to grow and they will continue to become tender. A yellow fluid (colostrum) may leak from your breasts. This is the first milk you are producing for your baby.  Your belly button may stick out.  You may notice more swelling in your hands,  face, or ankles.  You may have increased tingling or numbness in your hands, arms, and legs. The skin on your belly may also feel numb.  You may feel short of breath because of your expanding uterus.  You may have more problems sleeping. This can be caused by the size of your belly, increased need to urinate, and an increase in your body's metabolism.  You may notice the fetus "dropping," or moving lower in your abdomen (lightening).  You may have increased vaginal discharge.  You may notice your joints feel loose and you may have pain around your pelvic bone.  What to expect at prenatal visits You will have prenatal exams every 2 weeks until week 36. Then you will have weekly prenatal exams. During a routine prenatal visit:  You will be weighed to make sure you and the baby are growing normally.  Your blood pressure will be taken.  Your abdomen will be measured to track your baby's growth.  The fetal heartbeat will be listened to.  Any test results from the previous visit will be discussed.  You may have a cervical check near your due date to see if your cervix has softened or thinned (effaced).  You will be tested for Group B streptococcus. This happens between 35 and 37 weeks.  Your health care provider may ask you:  What your birth plan is.  How you are feeling.  If you are feeling the baby move.  If you have had   any abnormal symptoms, such as leaking fluid, bleeding, severe headaches, or abdominal cramping.  If you are using any tobacco products, including cigarettes, chewing tobacco, and electronic cigarettes.  If you have any questions.  Other tests or screenings that may be performed during your third trimester include:  Blood tests that check for low iron levels (anemia).  Fetal testing to check the health, activity level, and growth of the fetus. Testing is done if you have certain medical conditions or if there are problems during the  pregnancy.  Nonstress test (NST). This test checks the health of your baby to make sure there are no signs of problems, such as the baby not getting enough oxygen. During this test, a belt is placed around your belly. The baby is made to move, and its heart rate is monitored during movement.  What is false labor? False labor is a condition in which you feel small, irregular tightenings of the muscles in the womb (contractions) that usually go away with rest, changing position, or drinking water. These are called Braxton Hicks contractions. Contractions may last for hours, days, or even weeks before true labor sets in. If contractions come at regular intervals, become more frequent, increase in intensity, or become painful, you should see your health care provider. What are the signs of labor?  Abdominal cramps.  Regular contractions that start at 10 minutes apart and become stronger and more frequent with time.  Contractions that start on the top of the uterus and spread down to the lower abdomen and back.  Increased pelvic pressure and dull back pain.  A watery or bloody mucus discharge that comes from the vagina.  Leaking of amniotic fluid. This is also known as your "water breaking." It could be a slow trickle or a gush. Let your health care provider know if it has a color or strange odor. If you have any of these signs, call your health care provider right away, even if it is before your due date. Follow these instructions at home: Medicines  Follow your health care provider's instructions regarding medicine use. Specific medicines may be either safe or unsafe to take during pregnancy.  Take a prenatal vitamin that contains at least 600 micrograms (mcg) of folic acid.  If you develop constipation, try taking a stool softener if your health care provider approves. Eating and drinking  Eat a balanced diet that includes fresh fruits and vegetables, whole grains, good sources of protein  such as meat, eggs, or tofu, and low-fat dairy. Your health care provider will help you determine the amount of weight gain that is right for you.  Avoid raw meat and uncooked cheese. These carry germs that can cause birth defects in the baby.  If you have low calcium intake from food, talk to your health care provider about whether you should take a daily calcium supplement.  Eat four or five small meals rather than three large meals a day.  Limit foods that are high in fat and processed sugars, such as fried and sweet foods.  To prevent constipation: ? Drink enough fluid to keep your urine clear or pale yellow. ? Eat foods that are high in fiber, such as fresh fruits and vegetables, whole grains, and beans. Activity  Exercise only as directed by your health care provider. Most women can continue their usual exercise routine during pregnancy. Try to exercise for 30 minutes at least 5 days a week. Stop exercising if you experience uterine contractions.  Avoid heavy   lifting.  Do not exercise in extreme heat or humidity, or at high altitudes.  Wear low-heel, comfortable shoes.  Practice good posture.  You may continue to have sex unless your health care provider tells you otherwise. Relieving pain and discomfort  Take frequent breaks and rest with your legs elevated if you have leg cramps or low back pain.  Take warm sitz baths to soothe any pain or discomfort caused by hemorrhoids. Use hemorrhoid cream if your health care provider approves.  Wear a good support bra to prevent discomfort from breast tenderness.  If you develop varicose veins: ? Wear support pantyhose or compression stockings as told by your healthcare provider. ? Elevate your feet for 15 minutes, 3-4 times a day. Prenatal care  Write down your questions. Take them to your prenatal visits.  Keep all your prenatal visits as told by your health care provider. This is important. Safety  Wear your seat belt at  all times when driving.  Make a list of emergency phone numbers, including numbers for family, friends, the hospital, and police and fire departments. General instructions  Avoid cat litter boxes and soil used by cats. These carry germs that can cause birth defects in the baby. If you have a cat, ask someone to clean the litter box for you.  Do not travel far distances unless it is absolutely necessary and only with the approval of your health care provider.  Do not use hot tubs, steam rooms, or saunas.  Do not drink alcohol.  Do not use any products that contain nicotine or tobacco, such as cigarettes and e-cigarettes. If you need help quitting, ask your health care provider.  Do not use any medicinal herbs or unprescribed drugs. These chemicals affect the formation and growth of the baby.  Do not douche or use tampons or scented sanitary pads.  Do not cross your legs for long periods of time.  To prepare for the arrival of your baby: ? Take prenatal classes to understand, practice, and ask questions about labor and delivery. ? Make a trial run to the hospital. ? Visit the hospital and tour the maternity area. ? Arrange for maternity or paternity leave through employers. ? Arrange for family and friends to take care of pets while you are in the hospital. ? Purchase a rear-facing car seat and make sure you know how to install it in your car. ? Pack your hospital bag. ? Prepare the baby's nursery. Make sure to remove all pillows and stuffed animals from the baby's crib to prevent suffocation.  Visit your dentist if you have not gone during your pregnancy. Use a soft toothbrush to brush your teeth and be gentle when you floss. Contact a health care provider if:  You are unsure if you are in labor or if your water has broken.  You become dizzy.  You have mild pelvic cramps, pelvic pressure, or nagging pain in your abdominal area.  You have lower back pain.  You have persistent  nausea, vomiting, or diarrhea.  You have an unusual or bad smelling vaginal discharge.  You have pain when you urinate. Get help right away if:  Your water breaks before 37 weeks.  You have regular contractions less than 5 minutes apart before 37 weeks.  You have a fever.  You are leaking fluid from your vagina.  You have spotting or bleeding from your vagina.  You have severe abdominal pain or cramping.  You have rapid weight loss or weight gain.    You have shortness of breath with chest pain.  You notice sudden or extreme swelling of your face, hands, ankles, feet, or legs.  Your baby makes fewer than 10 movements in 2 hours.  You have severe headaches that do not go away when you take medicine.  You have vision changes. Summary  The third trimester is from week 28 through week 40, months 7 through 9. The third trimester is a time when the unborn baby (fetus) is growing rapidly.  During the third trimester, your discomfort may increase as you and your baby continue to gain weight. You may have abdominal, leg, and back pain, sleeping problems, and an increased need to urinate.  During the third trimester your breasts will keep growing and they will continue to become tender. A yellow fluid (colostrum) may leak from your breasts. This is the first milk you are producing for your baby.  False labor is a condition in which you feel small, irregular tightenings of the muscles in the womb (contractions) that eventually go away. These are called Braxton Hicks contractions. Contractions may last for hours, days, or even weeks before true labor sets in.  Signs of labor can include: abdominal cramps; regular contractions that start at 10 minutes apart and become stronger and more frequent with time; watery or bloody mucus discharge that comes from the vagina; increased pelvic pressure and dull back pain; and leaking of amniotic fluid. This information is not intended to replace advice  given to you by your health care provider. Make sure you discuss any questions you have with your health care provider. Document Released: 07/01/2001 Document Revised: 12/13/2015 Document Reviewed: 09/07/2012 Elsevier Interactive Patient Education  2017 [employer]  

## 2016-09-24 NOTE — Progress Notes (Signed)
FHR and BP normal, RTC 1 week

## 2016-10-01 ENCOUNTER — Ambulatory Visit (INDEPENDENT_AMBULATORY_CARE_PROVIDER_SITE_OTHER): Payer: Medicaid Other | Admitting: Obstetrics & Gynecology

## 2016-10-01 VITALS — BP 114/68 | HR 102 | Wt 187.7 lb

## 2016-10-01 DIAGNOSIS — Z3403 Encounter for supervision of normal first pregnancy, third trimester: Secondary | ICD-10-CM

## 2016-10-01 NOTE — Patient Instructions (Signed)
Third Trimester of Pregnancy The third trimester is from week 28 through week 40 (months 7 through 9). The third trimester is a time when the unborn baby (fetus) is growing rapidly. At the end of the ninth month, the fetus is about 20 inches in length and weighs 6-10 pounds. Body changes during your third trimester Your body will continue to go through many changes during pregnancy. The changes vary from woman to woman. During the third trimester:  Your weight will continue to increase. You can expect to gain 25-35 pounds (11-16 kg) by the end of the pregnancy.  You may begin to get stretch marks on your hips, abdomen, and breasts.  You may urinate more often because the fetus is moving lower into your pelvis and pressing on your bladder.  You may develop or continue to have heartburn. This is caused by increased hormones that slow down muscles in the digestive tract.  You may develop or continue to have constipation because increased hormones slow digestion and cause the muscles that push waste through your intestines to relax.  You may develop hemorrhoids. These are swollen veins (varicose veins) in the rectum that can itch or be painful.  You may develop swollen, bulging veins (varicose veins) in your legs.  You may have increased body aches in the pelvis, back, or thighs. This is due to weight gain and increased hormones that are relaxing your joints.  You may have changes in your hair. These can include thickening of your hair, rapid growth, and changes in texture. Some women also have hair loss during or after pregnancy, or hair that feels dry or thin. Your hair will most likely return to normal after your baby is born.  Your breasts will continue to grow and they will continue to become tender. A yellow fluid (colostrum) may leak from your breasts. This is the first milk you are producing for your baby.  Your belly button may stick out.  You may notice more swelling in your hands,  face, or ankles.  You may have increased tingling or numbness in your hands, arms, and legs. The skin on your belly may also feel numb.  You may feel short of breath because of your expanding uterus.  You may have more problems sleeping. This can be caused by the size of your belly, increased need to urinate, and an increase in your body's metabolism.  You may notice the fetus "dropping," or moving lower in your abdomen (lightening).  You may have increased vaginal discharge.  You may notice your joints feel loose and you may have pain around your pelvic bone.  What to expect at prenatal visits You will have prenatal exams every 2 weeks until week 36. Then you will have weekly prenatal exams. During a routine prenatal visit:  You will be weighed to make sure you and the baby are growing normally.  Your blood pressure will be taken.  Your abdomen will be measured to track your baby's growth.  The fetal heartbeat will be listened to.  Any test results from the previous visit will be discussed.  You may have a cervical check near your due date to see if your cervix has softened or thinned (effaced).  You will be tested for Group B streptococcus. This happens between 35 and 37 weeks.  Your health care provider may ask you:  What your birth plan is.  How you are feeling.  If you are feeling the baby move.  If you have had   any abnormal symptoms, such as leaking fluid, bleeding, severe headaches, or abdominal cramping.  If you are using any tobacco products, including cigarettes, chewing tobacco, and electronic cigarettes.  If you have any questions.  Other tests or screenings that may be performed during your third trimester include:  Blood tests that check for low iron levels (anemia).  Fetal testing to check the health, activity level, and growth of the fetus. Testing is done if you have certain medical conditions or if there are problems during the  pregnancy.  Nonstress test (NST). This test checks the health of your baby to make sure there are no signs of problems, such as the baby not getting enough oxygen. During this test, a belt is placed around your belly. The baby is made to move, and its heart rate is monitored during movement.  What is false labor? False labor is a condition in which you feel small, irregular tightenings of the muscles in the womb (contractions) that usually go away with rest, changing position, or drinking water. These are called Braxton Hicks contractions. Contractions may last for hours, days, or even weeks before true labor sets in. If contractions come at regular intervals, become more frequent, increase in intensity, or become painful, you should see your health care provider. What are the signs of labor?  Abdominal cramps.  Regular contractions that start at 10 minutes apart and become stronger and more frequent with time.  Contractions that start on the top of the uterus and spread down to the lower abdomen and back.  Increased pelvic pressure and dull back pain.  A watery or bloody mucus discharge that comes from the vagina.  Leaking of amniotic fluid. This is also known as your "water breaking." It could be a slow trickle or a gush. Let your health care provider know if it has a color or strange odor. If you have any of these signs, call your health care provider right away, even if it is before your due date. Follow these instructions at home: Medicines  Follow your health care provider's instructions regarding medicine use. Specific medicines may be either safe or unsafe to take during pregnancy.  Take a prenatal vitamin that contains at least 600 micrograms (mcg) of folic acid.  If you develop constipation, try taking a stool softener if your health care provider approves. Eating and drinking  Eat a balanced diet that includes fresh fruits and vegetables, whole grains, good sources of protein  such as meat, eggs, or tofu, and low-fat dairy. Your health care provider will help you determine the amount of weight gain that is right for you.  Avoid raw meat and uncooked cheese. These carry germs that can cause birth defects in the baby.  If you have low calcium intake from food, talk to your health care provider about whether you should take a daily calcium supplement.  Eat four or five small meals rather than three large meals a day.  Limit foods that are high in fat and processed sugars, such as fried and sweet foods.  To prevent constipation: ? Drink enough fluid to keep your urine clear or pale yellow. ? Eat foods that are high in fiber, such as fresh fruits and vegetables, whole grains, and beans. Activity  Exercise only as directed by your health care provider. Most women can continue their usual exercise routine during pregnancy. Try to exercise for 30 minutes at least 5 days a week. Stop exercising if you experience uterine contractions.  Avoid heavy   lifting.  Do not exercise in extreme heat or humidity, or at high altitudes.  Wear low-heel, comfortable shoes.  Practice good posture.  You may continue to have sex unless your health care provider tells you otherwise. Relieving pain and discomfort  Take frequent breaks and rest with your legs elevated if you have leg cramps or low back pain.  Take warm sitz baths to soothe any pain or discomfort caused by hemorrhoids. Use hemorrhoid cream if your health care provider approves.  Wear a good support bra to prevent discomfort from breast tenderness.  If you develop varicose veins: ? Wear support pantyhose or compression stockings as told by your healthcare provider. ? Elevate your feet for 15 minutes, 3-4 times a day. Prenatal care  Write down your questions. Take them to your prenatal visits.  Keep all your prenatal visits as told by your health care provider. This is important. Safety  Wear your seat belt at  all times when driving.  Make a list of emergency phone numbers, including numbers for family, friends, the hospital, and police and fire departments. General instructions  Avoid cat litter boxes and soil used by cats. These carry germs that can cause birth defects in the baby. If you have a cat, ask someone to clean the litter box for you.  Do not travel far distances unless it is absolutely necessary and only with the approval of your health care provider.  Do not use hot tubs, steam rooms, or saunas.  Do not drink alcohol.  Do not use any products that contain nicotine or tobacco, such as cigarettes and e-cigarettes. If you need help quitting, ask your health care provider.  Do not use any medicinal herbs or unprescribed drugs. These chemicals affect the formation and growth of the baby.  Do not douche or use tampons or scented sanitary pads.  Do not cross your legs for long periods of time.  To prepare for the arrival of your baby: ? Take prenatal classes to understand, practice, and ask questions about labor and delivery. ? Make a trial run to the hospital. ? Visit the hospital and tour the maternity area. ? Arrange for maternity or paternity leave through employers. ? Arrange for family and friends to take care of pets while you are in the hospital. ? Purchase a rear-facing car seat and make sure you know how to install it in your car. ? Pack your hospital bag. ? Prepare the baby's nursery. Make sure to remove all pillows and stuffed animals from the baby's crib to prevent suffocation.  Visit your dentist if you have not gone during your pregnancy. Use a soft toothbrush to brush your teeth and be gentle when you floss. Contact a health care provider if:  You are unsure if you are in labor or if your water has broken.  You become dizzy.  You have mild pelvic cramps, pelvic pressure, or nagging pain in your abdominal area.  You have lower back pain.  You have persistent  nausea, vomiting, or diarrhea.  You have an unusual or bad smelling vaginal discharge.  You have pain when you urinate. Get help right away if:  Your water breaks before 37 weeks.  You have regular contractions less than 5 minutes apart before 37 weeks.  You have a fever.  You are leaking fluid from your vagina.  You have spotting or bleeding from your vagina.  You have severe abdominal pain or cramping.  You have rapid weight loss or weight gain.    You have shortness of breath with chest pain.  You notice sudden or extreme swelling of your face, hands, ankles, feet, or legs.  Your baby makes fewer than 10 movements in 2 hours.  You have severe headaches that do not go away when you take medicine.  You have vision changes. Summary  The third trimester is from week 28 through week 40, months 7 through 9. The third trimester is a time when the unborn baby (fetus) is growing rapidly.  During the third trimester, your discomfort may increase as you and your baby continue to gain weight. You may have abdominal, leg, and back pain, sleeping problems, and an increased need to urinate.  During the third trimester your breasts will keep growing and they will continue to become tender. A yellow fluid (colostrum) may leak from your breasts. This is the first milk you are producing for your baby.  False labor is a condition in which you feel small, irregular tightenings of the muscles in the womb (contractions) that eventually go away. These are called Braxton Hicks contractions. Contractions may last for hours, days, or even weeks before true labor sets in.  Signs of labor can include: abdominal cramps; regular contractions that start at 10 minutes apart and become stronger and more frequent with time; watery or bloody mucus discharge that comes from the vagina; increased pelvic pressure and dull back pain; and leaking of amniotic fluid. This information is not intended to replace advice  given to you by your health care provider. Make sure you discuss any questions you have with your health care provider. Document Released: 07/01/2001 Document Revised: 12/13/2015 Document Reviewed: 09/07/2012 Elsevier Interactive Patient Education  2017 [employer]  

## 2016-10-01 NOTE — Progress Notes (Signed)
   PRENATAL VISIT NOTE  Subjective:  Kathleen Zhang is a 21 y.o. G1P0 at [redacted]w[redacted]d being seen today for ongoing prenatal care.  She is currently monitored for the following issues for this high-risk pregnancy and has Supervision of normal first pregnancy; Late prenatal care affecting pregnancy in second trimester; and Fetal arrhythmia affecting pregnancy, antepartum on her problem list.  Patient reports no complaints.  Contractions: Not present. Vag. Bleeding: None.  Movement: Present. Denies leaking of fluid.   The following portions of the patient's history were reviewed and updated as appropriate: allergies, current medications, past family history, past medical history, past social history, past surgical history and problem list. Problem list updated.  Objective:   Vitals:   10/01/16 1516  BP: 114/68  Pulse: (!) 102  Weight: 187 lb 11.2 oz (85.1 kg)    Fetal Status: Fetal Heart Rate (bpm): 153   Movement: Present     General:  Alert, oriented and cooperative. Patient is in no acute distress.  Skin: Skin is warm and dry. No rash noted.   Cardiovascular: Normal heart rate noted  Respiratory: Normal respiratory effort, no problems with respiration noted  Abdomen: Soft, gravid, appropriate for gestational age. Pain/Pressure: Absent     Pelvic:  Cervical exam deferred        Extremities: Normal range of motion.  Edema: None  Mental Status: Normal mood and affect. Normal behavior. Normal judgment and thought content.   Assessment and Plan:  Pregnancy: G1P0 at [redacted]w[redacted]d  There are no diagnoses linked to this encounter. Preterm labor symptoms and general obstetric precautions including but not limited to vaginal bleeding, contractions, leaking of fluid and fetal movement were reviewed in detail with the patient. Please refer to After Visit Summary for other counseling recommendations.  Return in about 2 weeks (around 10/15/2016). F/u US is tomorrow  Adam PhenixJames G Melizza Kanode, MD

## 2016-10-02 ENCOUNTER — Ambulatory Visit (HOSPITAL_COMMUNITY)
Admission: RE | Admit: 2016-10-02 | Discharge: 2016-10-02 | Disposition: A | Discharge status: Home / Self Care | Payer: Medicaid Other | Source: Ambulatory Visit | Attending: Obstetrics & Gynecology | Admitting: Obstetrics & Gynecology

## 2016-10-02 ENCOUNTER — Encounter (HOSPITAL_COMMUNITY): Payer: Self-pay

## 2016-10-02 VITALS — BP 143/77 | HR 109 | Wt 187.2 lb

## 2016-10-02 DIAGNOSIS — Z3A32 32 weeks gestation of pregnancy: Secondary | ICD-10-CM | POA: Diagnosis not present

## 2016-10-02 DIAGNOSIS — O365920 Maternal care for other known or suspected poor fetal growth, second trimester, not applicable or unspecified: Secondary | ICD-10-CM

## 2016-10-02 DIAGNOSIS — O358XX0 Maternal care for other (suspected) fetal abnormality and damage, not applicable or unspecified: Secondary | ICD-10-CM | POA: Insufficient documentation

## 2016-10-02 DIAGNOSIS — IMO0001 Reserved for inherently not codable concepts without codable children: Secondary | ICD-10-CM

## 2016-10-02 DIAGNOSIS — O365930 Maternal care for other known or suspected poor fetal growth, third trimester, not applicable or unspecified: Secondary | ICD-10-CM | POA: Diagnosis not present

## 2016-10-15 ENCOUNTER — Encounter: Payer: Self-pay | Admitting: Obstetrics and Gynecology

## 2016-10-15 ENCOUNTER — Ambulatory Visit (INDEPENDENT_AMBULATORY_CARE_PROVIDER_SITE_OTHER): Payer: Medicaid Other | Admitting: Obstetrics and Gynecology

## 2016-10-15 VITALS — BP 115/72 | HR 93 | Wt 191.0 lb

## 2016-10-15 DIAGNOSIS — Z3403 Encounter for supervision of normal first pregnancy, third trimester: Secondary | ICD-10-CM

## 2016-10-15 DIAGNOSIS — IMO0002 Reserved for concepts with insufficient information to code with codable children: Secondary | ICD-10-CM | POA: Insufficient documentation

## 2016-10-15 DIAGNOSIS — O358XX0 Maternal care for other (suspected) fetal abnormality and damage, not applicable or unspecified: Secondary | ICD-10-CM

## 2016-10-15 DIAGNOSIS — IMO0001 Reserved for inherently not codable concepts without codable children: Secondary | ICD-10-CM

## 2016-10-15 NOTE — Progress Notes (Signed)
Subjective:  Kathleen Zhang is a 21 y.o. G1P0 at 4771w5d being seen today for ongoing prenatal care.  She is currently monitored for the following issues for this low-risk pregnancy and has Supervision of normal first pregnancy; Late prenatal care affecting pregnancy in second trimester; and Fetal renal anomaly on her problem list.  Patient reports no complaints.  Contractions: Not present. Vag. Bleeding: None.  Movement: Present. Denies leaking of fluid.   The following portions of the patient's history were reviewed and updated as appropriate: allergies, current medications, past family history, past medical history, past social history, past surgical history and problem list. Problem list updated.  Objective:   Vitals:   10/15/16 1401 10/15/16 1411  BP: 136/89 115/72  Pulse: (!) 108 93  Weight: 191 lb (86.6 kg)     Fetal Status: Fetal Heart Rate (bpm): 154   Movement: Present     General:  Alert, oriented and cooperative. Patient is in no acute distress.  Skin: Skin is warm and dry. No rash noted.   Cardiovascular: Normal heart rate noted  Respiratory: Normal respiratory effort, no problems with respiration noted  Abdomen: Soft, gravid, appropriate for gestational age. Pain/Pressure: Absent     Pelvic:  Cervical exam deferred        Extremities: Normal range of motion.  Edema: None  Mental Status: Normal mood and affect. Normal behavior. Normal judgment and thought content.   Urinalysis:      Assessment and Plan:  Pregnancy: G1P0 at 8471w5d  1. Encounter for supervision of normal first pregnancy in third trimester Stable GBS next visit  2. Anomaly of kidney of fetus, single or unspecified fetus F/U U/S order for next week. Renal cyst appear to be simple in nature  Preterm labor symptoms and general obstetric precautions including but not limited to vaginal bleeding, contractions, leaking of fluid and fetal movement were reviewed in detail with the patient. Please refer to After  Visit Summary for other counseling recommendations.  Return in about 1 week (around 10/22/2016) for OB visit.   Hermina StaggersMichael L Rishita Petron, MD

## 2016-10-23 ENCOUNTER — Other Ambulatory Visit (HOSPITAL_COMMUNITY)
Admission: RE | Admit: 2016-10-23 | Discharge: 2016-10-23 | Disposition: A | Discharge status: Home / Self Care | Payer: Medicaid Other | Source: Ambulatory Visit | Attending: Obstetrics & Gynecology | Admitting: Obstetrics & Gynecology

## 2016-10-23 ENCOUNTER — Encounter (HOSPITAL_COMMUNITY): Payer: Self-pay

## 2016-10-23 ENCOUNTER — Ambulatory Visit (HOSPITAL_COMMUNITY)
Admission: RE | Admit: 2016-10-23 | Discharge: 2016-10-23 | Disposition: A | Discharge status: Home / Self Care | Payer: Medicaid Other | Source: Ambulatory Visit | Attending: Obstetrics & Gynecology | Admitting: Obstetrics & Gynecology

## 2016-10-23 ENCOUNTER — Encounter: Payer: Self-pay | Admitting: Obstetrics & Gynecology

## 2016-10-23 ENCOUNTER — Other Ambulatory Visit (HOSPITAL_COMMUNITY): Payer: Self-pay | Admitting: Maternal and Fetal Medicine

## 2016-10-23 ENCOUNTER — Ambulatory Visit (INDEPENDENT_AMBULATORY_CARE_PROVIDER_SITE_OTHER): Payer: Medicaid Other | Admitting: Obstetrics & Gynecology

## 2016-10-23 VITALS — BP 119/78 | HR 106 | Wt 195.0 lb

## 2016-10-23 DIAGNOSIS — Z113 Encounter for screening for infections with a predominantly sexual mode of transmission: Secondary | ICD-10-CM | POA: Insufficient documentation

## 2016-10-23 DIAGNOSIS — IMO0001 Reserved for inherently not codable concepts without codable children: Secondary | ICD-10-CM

## 2016-10-23 DIAGNOSIS — O358XX0 Maternal care for other (suspected) fetal abnormality and damage, not applicable or unspecified: Secondary | ICD-10-CM | POA: Diagnosis not present

## 2016-10-23 DIAGNOSIS — Z3A35 35 weeks gestation of pregnancy: Secondary | ICD-10-CM | POA: Insufficient documentation

## 2016-10-23 DIAGNOSIS — Z3403 Encounter for supervision of normal first pregnancy, third trimester: Secondary | ICD-10-CM

## 2016-10-23 LAB — OB RESULTS CONSOLE GC/CHLAMYDIA: Gonorrhea: NEGATIVE

## 2016-10-23 NOTE — Progress Notes (Signed)
Pt presents for ROB GBS/GC/CT due today  

## 2016-10-23 NOTE — Progress Notes (Signed)
   PRENATAL VISIT NOTE  Subjective:  Kathleen Zhang is a 21 y.o. G1P0 at [redacted]w[redacted]d being seen today for ongoing prenatal care.  She is currently monitored for the following issues for this low-risk pregnancy and has Supervision of normal first pregnancy and Fetal renal anomaly on her problem list.  Patient reports no complaints.  Contractions: Not present. Vag. Bleeding: None.  Movement: Present. Denies leaking of fluid.   The following portions of the patient's history were reviewed and updated as appropriate: allergies, current medications, past family history, past medical history, past social history, past surgical history and problem list. Problem list updated.  Objective:   Vitals:   10/23/16 1122  BP: 119/78  Pulse: (!) 106  Weight: 195 lb (88.5 kg)    Fetal Status: Fetal Heart Rate (bpm): 145   Movement: Present     General:  Alert, oriented and cooperative. Patient is in no acute distress.  Skin: Skin is warm and dry. No rash noted.   Cardiovascular: Normal heart rate noted  Respiratory: Normal respiratory effort, no problems with respiration noted  Abdomen: Soft, gravid, appropriate for gestational age. Pain/Pressure: Absent     Pelvic:  Cervical exam performed        Extremities: Normal range of motion.  Edema: None  Mental Status: Normal mood and affect. Normal behavior. Normal judgment and thought content.   Assessment and Plan:  Pregnancy: G1P0 at [redacted]w[redacted]d  1. Encounter for supervision of normal first pregnancy in third trimester   Preterm labor symptoms and general obstetric precautions including but not limited to vaginal bleeding, contractions, leaking of fluid and fetal movement were reviewed in detail with the patient. Please refer to After Visit Summary for other counseling recommendations.  Return in about 1 week (around 10/30/2016).   Adam Phenix, MD

## 2016-10-23 NOTE — Patient Instructions (Signed)
Third Trimester of Pregnancy The third trimester is from week 28 through week 40 (months 7 through 9). The third trimester is a time when the unborn baby (fetus) is growing rapidly. At the end of the ninth month, the fetus is about 20 inches in length and weighs 6-10 pounds. Body changes during your third trimester Your body will continue to go through many changes during pregnancy. The changes vary from woman to woman. During the third trimester:  Your weight will continue to increase. You can expect to gain 25-35 pounds (11-16 kg) by the end of the pregnancy.  You may begin to get stretch marks on your hips, abdomen, and breasts.  You may urinate more often because the fetus is moving lower into your pelvis and pressing on your bladder.  You may develop or continue to have heartburn. This is caused by increased hormones that slow down muscles in the digestive tract.  You may develop or continue to have constipation because increased hormones slow digestion and cause the muscles that push waste through your intestines to relax.  You may develop hemorrhoids. These are swollen veins (varicose veins) in the rectum that can itch or be painful.  You may develop swollen, bulging veins (varicose veins) in your legs.  You may have increased body aches in the pelvis, back, or thighs. This is due to weight gain and increased hormones that are relaxing your joints.  You may have changes in your hair. These can include thickening of your hair, rapid growth, and changes in texture. Some women also have hair loss during or after pregnancy, or hair that feels dry or thin. Your hair will most likely return to normal after your baby is born.  Your breasts will continue to grow and they will continue to become tender. A yellow fluid (colostrum) may leak from your breasts. This is the first milk you are producing for your baby.  Your belly button may stick out.  You may notice more swelling in your hands,  face, or ankles.  You may have increased tingling or numbness in your hands, arms, and legs. The skin on your belly may also feel numb.  You may feel short of breath because of your expanding uterus.  You may have more problems sleeping. This can be caused by the size of your belly, increased need to urinate, and an increase in your body's metabolism.  You may notice the fetus "dropping," or moving lower in your abdomen (lightening).  You may have increased vaginal discharge.  You may notice your joints feel loose and you may have pain around your pelvic bone.  What to expect at prenatal visits You will have prenatal exams every 2 weeks until week 36. Then you will have weekly prenatal exams. During a routine prenatal visit:  You will be weighed to make sure you and the baby are growing normally.  Your blood pressure will be taken.  Your abdomen will be measured to track your baby's growth.  The fetal heartbeat will be listened to.  Any test results from the previous visit will be discussed.  You may have a cervical check near your due date to see if your cervix has softened or thinned (effaced).  You will be tested for Group B streptococcus. This happens between 35 and 37 weeks.  Your health care provider may ask you:  What your birth plan is.  How you are feeling.  If you are feeling the baby move.  If you have had   any abnormal symptoms, such as leaking fluid, bleeding, severe headaches, or abdominal cramping.  If you are using any tobacco products, including cigarettes, chewing tobacco, and electronic cigarettes.  If you have any questions.  Other tests or screenings that may be performed during your third trimester include:  Blood tests that check for low iron levels (anemia).  Fetal testing to check the health, activity level, and growth of the fetus. Testing is done if you have certain medical conditions or if there are problems during the  pregnancy.  Nonstress test (NST). This test checks the health of your baby to make sure there are no signs of problems, such as the baby not getting enough oxygen. During this test, a belt is placed around your belly. The baby is made to move, and its heart rate is monitored during movement.  What is false labor? False labor is a condition in which you feel small, irregular tightenings of the muscles in the womb (contractions) that usually go away with rest, changing position, or drinking water. These are called Braxton Hicks contractions. Contractions may last for hours, days, or even weeks before true labor sets in. If contractions come at regular intervals, become more frequent, increase in intensity, or become painful, you should see your health care provider. What are the signs of labor?  Abdominal cramps.  Regular contractions that start at 10 minutes apart and become stronger and more frequent with time.  Contractions that start on the top of the uterus and spread down to the lower abdomen and back.  Increased pelvic pressure and dull back pain.  A watery or bloody mucus discharge that comes from the vagina.  Leaking of amniotic fluid. This is also known as your "water breaking." It could be a slow trickle or a gush. Let your health care provider know if it has a color or strange odor. If you have any of these signs, call your health care provider right away, even if it is before your due date. Follow these instructions at home: Medicines  Follow your health care provider's instructions regarding medicine use. Specific medicines may be either safe or unsafe to take during pregnancy.  Take a prenatal vitamin that contains at least 600 micrograms (mcg) of folic acid.  If you develop constipation, try taking a stool softener if your health care provider approves. Eating and drinking  Eat a balanced diet that includes fresh fruits and vegetables, whole grains, good sources of protein  such as meat, eggs, or tofu, and low-fat dairy. Your health care provider will help you determine the amount of weight gain that is right for you.  Avoid raw meat and uncooked cheese. These carry germs that can cause birth defects in the baby.  If you have low calcium intake from food, talk to your health care provider about whether you should take a daily calcium supplement.  Eat four or five small meals rather than three large meals a day.  Limit foods that are high in fat and processed sugars, such as fried and sweet foods.  To prevent constipation: ? Drink enough fluid to keep your urine clear or pale yellow. ? Eat foods that are high in fiber, such as fresh fruits and vegetables, whole grains, and beans. Activity  Exercise only as directed by your health care provider. Most women can continue their usual exercise routine during pregnancy. Try to exercise for 30 minutes at least 5 days a week. Stop exercising if you experience uterine contractions.  Avoid heavy   lifting.  Do not exercise in extreme heat or humidity, or at high altitudes.  Wear low-heel, comfortable shoes.  Practice good posture.  You may continue to have sex unless your health care provider tells you otherwise. Relieving pain and discomfort  Take frequent breaks and rest with your legs elevated if you have leg cramps or low back pain.  Take warm sitz baths to soothe any pain or discomfort caused by hemorrhoids. Use hemorrhoid cream if your health care provider approves.  Wear a good support bra to prevent discomfort from breast tenderness.  If you develop varicose veins: ? Wear support pantyhose or compression stockings as told by your healthcare provider. ? Elevate your feet for 15 minutes, 3-4 times a day. Prenatal care  Write down your questions. Take them to your prenatal visits.  Keep all your prenatal visits as told by your health care provider. This is important. Safety  Wear your seat belt at  all times when driving.  Make a list of emergency phone numbers, including numbers for family, friends, the hospital, and police and fire departments. General instructions  Avoid cat litter boxes and soil used by cats. These carry germs that can cause birth defects in the baby. If you have a cat, ask someone to clean the litter box for you.  Do not travel far distances unless it is absolutely necessary and only with the approval of your health care provider.  Do not use hot tubs, steam rooms, or saunas.  Do not drink alcohol.  Do not use any products that contain nicotine or tobacco, such as cigarettes and e-cigarettes. If you need help quitting, ask your health care provider.  Do not use any medicinal herbs or unprescribed drugs. These chemicals affect the formation and growth of the baby.  Do not douche or use tampons or scented sanitary pads.  Do not cross your legs for long periods of time.  To prepare for the arrival of your baby: ? Take prenatal classes to understand, practice, and ask questions about labor and delivery. ? Make a trial run to the hospital. ? Visit the hospital and tour the maternity area. ? Arrange for maternity or paternity leave through employers. ? Arrange for family and friends to take care of pets while you are in the hospital. ? Purchase a rear-facing car seat and make sure you know how to install it in your car. ? Pack your hospital bag. ? Prepare the baby's nursery. Make sure to remove all pillows and stuffed animals from the baby's crib to prevent suffocation.  Visit your dentist if you have not gone during your pregnancy. Use a soft toothbrush to brush your teeth and be gentle when you floss. Contact a health care provider if:  You are unsure if you are in labor or if your water has broken.  You become dizzy.  You have mild pelvic cramps, pelvic pressure, or nagging pain in your abdominal area.  You have lower back pain.  You have persistent  nausea, vomiting, or diarrhea.  You have an unusual or bad smelling vaginal discharge.  You have pain when you urinate. Get help right away if:  Your water breaks before 37 weeks.  You have regular contractions less than 5 minutes apart before 37 weeks.  You have a fever.  You are leaking fluid from your vagina.  You have spotting or bleeding from your vagina.  You have severe abdominal pain or cramping.  You have rapid weight loss or weight gain.    You have shortness of breath with chest pain.  You notice sudden or extreme swelling of your face, hands, ankles, feet, or legs.  Your baby makes fewer than 10 movements in 2 hours.  You have severe headaches that do not go away when you take medicine.  You have vision changes. Summary  The third trimester is from week 28 through week 40, months 7 through 9. The third trimester is a time when the unborn baby (fetus) is growing rapidly.  During the third trimester, your discomfort may increase as you and your baby continue to gain weight. You may have abdominal, leg, and back pain, sleeping problems, and an increased need to urinate.  During the third trimester your breasts will keep growing and they will continue to become tender. A yellow fluid (colostrum) may leak from your breasts. This is the first milk you are producing for your baby.  False labor is a condition in which you feel small, irregular tightenings of the muscles in the womb (contractions) that eventually go away. These are called Braxton Hicks contractions. Contractions may last for hours, days, or even weeks before true labor sets in.  Signs of labor can include: abdominal cramps; regular contractions that start at 10 minutes apart and become stronger and more frequent with time; watery or bloody mucus discharge that comes from the vagina; increased pelvic pressure and dull back pain; and leaking of amniotic fluid. This information is not intended to replace advice  given to you by your health care provider. Make sure you discuss any questions you have with your health care provider. Document Released: 07/01/2001 Document Revised: 12/13/2015 Document Reviewed: 09/07/2012 Elsevier Interactive Patient Education  2017 [employer]  

## 2016-10-24 LAB — CERVICOVAGINAL ANCILLARY ONLY
CHLAMYDIA, DNA PROBE: NEGATIVE
NEISSERIA GONORRHEA: NEGATIVE
Trichomonas: NEGATIVE

## 2016-10-25 LAB — OB RESULTS CONSOLE GBS: STREP GROUP B AG: POSITIVE

## 2016-10-25 LAB — STREP GP B NAA: STREP GROUP B AG: POSITIVE — AB

## 2016-10-30 ENCOUNTER — Ambulatory Visit (INDEPENDENT_AMBULATORY_CARE_PROVIDER_SITE_OTHER): Payer: Medicaid Other | Admitting: Obstetrics & Gynecology

## 2016-10-30 DIAGNOSIS — O99820 Streptococcus B carrier state complicating pregnancy: Secondary | ICD-10-CM | POA: Insufficient documentation

## 2016-10-30 DIAGNOSIS — Z3403 Encounter for supervision of normal first pregnancy, third trimester: Secondary | ICD-10-CM

## 2016-10-30 NOTE — Progress Notes (Signed)
   PRENATAL VISIT NOTE  Subjective:  Norene Oliveri is a 21 y.o. G1P0 at [redacted]w[redacted]d being seen today for ongoing prenatal care.  She is currently monitored for the following issues for this low-risk pregnancy and has Supervision of normal first pregnancy and Fetal renal anomaly on her problem list.  Patient reports no complaints.  Contractions: Not present. Vag. Bleeding: None.  Movement: Present. Denies leaking of fluid.   The following portions of the patient's history were reviewed and updated as appropriate: allergies, current medications, past family history, past medical history, past social history, past surgical history and problem list. Problem list updated.  Objective:   Vitals:   10/30/16 1117 10/30/16 1120  BP: 138/85 122/83  Pulse: (!) 108 (!) 102  Weight: 199 lb 3.2 oz (90.4 kg)     Fetal Status: Fetal Heart Rate (bpm): 150 Fundal Height: 37 cm Movement: Present     General:  Alert, oriented and cooperative. Patient is in no acute distress.  Skin: Skin is warm and dry. No rash noted.   Cardiovascular: Normal heart rate noted  Respiratory: Normal respiratory effort, no problems with respiration noted  Abdomen: Soft, gravid, appropriate for gestational age. Pain/Pressure: Absent     Pelvic:  Cervical exam deferred        Extremities: Normal range of motion.  Edema: Trace  Mental Status: Normal mood and affect. Normal behavior. Normal judgment and thought content.   Assessment and Plan:  Pregnancy: G1P0 at [redacted]w[redacted]d  1. Encounter for supervision of normal first pregnancy in third trimester Patient informed about +GBS, need for treatment in labor.  Preterm labor symptoms and general obstetric precautions including but not limited to vaginal bleeding, contractions, leaking of fluid and fetal movement were reviewed in detail with the patient. Please refer to After Visit Summary for other counseling recommendations.  Return in about 1 week (around 11/06/2016) for OB Visit.   Tereso Newcomer, MD

## 2016-10-30 NOTE — Patient Instructions (Signed)
Return to clinic for any scheduled appointments or obstetric concerns, or go to [facility] for evaluation  

## 2016-11-06 ENCOUNTER — Ambulatory Visit (INDEPENDENT_AMBULATORY_CARE_PROVIDER_SITE_OTHER): Payer: Medicaid Other | Admitting: Obstetrics and Gynecology

## 2016-11-06 VITALS — BP 155/85 | HR 114 | Wt 201.4 lb

## 2016-11-06 DIAGNOSIS — O358XX0 Maternal care for other (suspected) fetal abnormality and damage, not applicable or unspecified: Secondary | ICD-10-CM

## 2016-11-06 DIAGNOSIS — O99820 Streptococcus B carrier state complicating pregnancy: Secondary | ICD-10-CM

## 2016-11-06 DIAGNOSIS — IMO0001 Reserved for inherently not codable concepts without codable children: Secondary | ICD-10-CM

## 2016-11-06 DIAGNOSIS — Z3403 Encounter for supervision of normal first pregnancy, third trimester: Secondary | ICD-10-CM

## 2016-11-06 NOTE — Progress Notes (Signed)
Patient reports good fetal movement, denies pain/contractions. 

## 2016-11-06 NOTE — Progress Notes (Signed)
   PRENATAL VISIT NOTE  Subjective:  Kathleen Zhang is a 21 y.o. G1P0 at [redacted]w[redacted]d being seen today for ongoing prenatal care.  She is currently monitored for the following issues for this low-risk pregnancy and has Supervision of normal first pregnancy; Fetal renal anomaly; and Group B Streptococcus carrier, +RV culture, currently pregnant on her problem list.  Patient reports no complaints.  Contractions: Not present. Vag. Bleeding: None.  Movement: Present. Denies leaking of fluid.   The following portions of the patient's history were reviewed and updated as appropriate: allergies, current medications, past family history, past medical history, past social history, past surgical history and problem list. Problem list updated.  Objective:   Vitals:   11/06/16 1034 11/06/16 1038  BP: 139/85 (!) 155/85  Pulse: (!) 111 (!) 114  Weight: 201 lb 6.4 oz (91.4 kg)     Fetal Status: Fetal Heart Rate (bpm): 158 Fundal Height: 38 cm Movement: Present     General:  Alert, oriented and cooperative. Patient is in no acute distress.  Skin: Skin is warm and dry. No rash noted.   Cardiovascular: Normal heart rate noted  Respiratory: Normal respiratory effort, no problems with respiration noted  Abdomen: Soft, gravid, appropriate for gestational age. Pain/Pressure: Absent     Pelvic:  Cervical exam deferred        Extremities: Normal range of motion.  Edema: Trace  Mental Status: Normal mood and affect. Normal behavior. Normal judgment and thought content.   Assessment and Plan:  Pregnancy: G1P0 at [redacted]w[redacted]d  1. Encounter for supervision of normal first pregnancy in third trimester Patient is doing well without complaints Patient noted to have elevated BP today without symptoms Labs ordered Plan for repeat BP on Monday. If elevated, patient to be induced that day. - CBC - Comprehensive metabolic panel - Protein / creatinine ratio, urine  2. Group B Streptococcus carrier, +RV culture, currently  pregnant Will provide prophylaxis in labor  3. Anomaly of kidney of fetus, single or unspecified fetus Will inform pediatrician at delivery for follow up  Term labor symptoms and general obstetric precautions including but not limited to vaginal bleeding, contractions, leaking of fluid and fetal movement were reviewed in detail with the patient. Please refer to After Visit Summary for other counseling recommendations.  Return in about 1 week (around 11/13/2016) for ROB.   Catalina Antigua, MD

## 2016-11-07 LAB — COMPREHENSIVE METABOLIC PANEL
ALBUMIN: 3.8 g/dL (ref 3.5–5.5)
ALK PHOS: 140 IU/L — AB (ref 39–117)
ALT: 19 IU/L (ref 0–32)
AST: 21 IU/L (ref 0–40)
Albumin/Globulin Ratio: 1.4 (ref 1.2–2.2)
BILIRUBIN TOTAL: 0.2 mg/dL (ref 0.0–1.2)
BUN / CREAT RATIO: 12 (ref 9–23)
BUN: 8 mg/dL (ref 6–20)
CHLORIDE: 102 mmol/L (ref 96–106)
CO2: 18 mmol/L (ref 18–29)
CREATININE: 0.67 mg/dL (ref 0.57–1.00)
Calcium: 9.3 mg/dL (ref 8.7–10.2)
GFR calc Af Amer: 144 mL/min/{1.73_m2} (ref >59)
GFR calc non Af Amer: 125 mL/min/{1.73_m2} (ref >59)
GLOBULIN, TOTAL: 2.8 g/dL (ref 1.5–4.5)
Glucose: 116 mg/dL — ABNORMAL HIGH (ref 65–99)
Potassium: 3.9 mmol/L (ref 3.5–5.2)
SODIUM: 138 mmol/L (ref 134–144)
Total Protein: 6.6 g/dL (ref 6.0–8.5)

## 2016-11-07 LAB — CBC
HEMATOCRIT: 36.3 % (ref 34.0–46.6)
HEMOGLOBIN: 12.4 g/dL (ref 11.1–15.9)
MCH: 29.6 pg (ref 26.6–33.0)
MCHC: 34.2 g/dL (ref 31.5–35.7)
MCV: 87 fL (ref 79–97)
Platelets: 171 10*3/uL (ref 150–379)
RBC: 4.19 x10E6/uL (ref 3.77–5.28)
RDW: 13.5 % (ref 12.3–15.4)
WBC: 6.3 10*3/uL (ref 3.4–10.8)

## 2016-11-07 LAB — PROTEIN / CREATININE RATIO, URINE
CREATININE, UR: 138.5 mg/dL
PROTEIN/CREAT RATIO: 217 mg/g{creat} — AB (ref 0–200)
Protein, Ur: 30.1 mg/dL

## 2016-11-10 ENCOUNTER — Ambulatory Visit: Payer: Medicaid Other | Admitting: Obstetrics and Gynecology

## 2016-11-10 ENCOUNTER — Encounter (HOSPITAL_COMMUNITY): Payer: Self-pay

## 2016-11-10 ENCOUNTER — Inpatient Hospital Stay (HOSPITAL_COMMUNITY)
Admission: AD | Admit: 2016-11-10 | Discharge: 2016-11-12 | DRG: 775 | Disposition: A | Discharge status: Home / Self Care | Payer: Medicaid Other | Source: Ambulatory Visit | Attending: Obstetrics & Gynecology | Admitting: Obstetrics & Gynecology

## 2016-11-10 VITALS — BP 156/99 | HR 114

## 2016-11-10 DIAGNOSIS — Z3A38 38 weeks gestation of pregnancy: Secondary | ICD-10-CM

## 2016-11-10 DIAGNOSIS — O134 Gestational [pregnancy-induced] hypertension without significant proteinuria, complicating childbirth: Principal | ICD-10-CM | POA: Diagnosis present

## 2016-11-10 DIAGNOSIS — O358XX0 Maternal care for other (suspected) fetal abnormality and damage, not applicable or unspecified: Secondary | ICD-10-CM | POA: Diagnosis present

## 2016-11-10 DIAGNOSIS — O99820 Streptococcus B carrier state complicating pregnancy: Secondary | ICD-10-CM

## 2016-11-10 DIAGNOSIS — O133 Gestational [pregnancy-induced] hypertension without significant proteinuria, third trimester: Secondary | ICD-10-CM

## 2016-11-10 DIAGNOSIS — O139 Gestational [pregnancy-induced] hypertension without significant proteinuria, unspecified trimester: Secondary | ICD-10-CM | POA: Diagnosis present

## 2016-11-10 DIAGNOSIS — Z3403 Encounter for supervision of normal first pregnancy, third trimester: Secondary | ICD-10-CM

## 2016-11-10 DIAGNOSIS — O99824 Streptococcus B carrier state complicating childbirth: Secondary | ICD-10-CM | POA: Diagnosis present

## 2016-11-10 LAB — COMPREHENSIVE METABOLIC PANEL
ALBUMIN: 3.3 g/dL — AB (ref 3.5–5.0)
ALT: 25 U/L (ref 14–54)
AST: 33 U/L (ref 15–41)
Alkaline Phosphatase: 142 U/L — ABNORMAL HIGH (ref 38–126)
Anion gap: 8 (ref 5–15)
BUN: 9 mg/dL (ref 6–20)
CHLORIDE: 105 mmol/L (ref 101–111)
CO2: 21 mmol/L — ABNORMAL LOW (ref 22–32)
Calcium: 9.3 mg/dL (ref 8.9–10.3)
Creatinine, Ser: 0.58 mg/dL (ref 0.44–1.00)
GFR calc Af Amer: >60 mL/min (ref >60)
Glucose, Bld: 118 mg/dL — ABNORMAL HIGH (ref 65–99)
POTASSIUM: 4.2 mmol/L (ref 3.5–5.1)
SODIUM: 134 mmol/L — AB (ref 135–145)
Total Bilirubin: 0.3 mg/dL (ref 0.3–1.2)
Total Protein: 7.2 g/dL (ref 6.5–8.1)

## 2016-11-10 LAB — TYPE AND SCREEN
ABO/RH(D): B POS
ANTIBODY SCREEN: NEGATIVE

## 2016-11-10 LAB — CBC
HCT: 37.6 % (ref 36.0–46.0)
Hemoglobin: 12.7 g/dL (ref 12.0–15.0)
MCH: 30 pg (ref 26.0–34.0)
MCHC: 33.8 g/dL (ref 30.0–36.0)
MCV: 88.7 fL (ref 78.0–100.0)
PLATELETS: 169 10*3/uL (ref 150–400)
RBC: 4.24 MIL/uL (ref 3.87–5.11)
RDW: 13.1 % (ref 11.5–15.5)
WBC: 6.2 10*3/uL (ref 4.0–10.5)

## 2016-11-10 LAB — ABO/RH: ABO/RH(D): B POS

## 2016-11-10 LAB — PROTEIN / CREATININE RATIO, URINE
CREATININE, URINE: 121 mg/dL
Protein Creatinine Ratio: 0.21 mg/mg{Cre} — ABNORMAL HIGH (ref 0.00–0.15)
TOTAL PROTEIN, URINE: 25 mg/dL

## 2016-11-10 MED ORDER — IBUPROFEN 600 MG PO TABS
600.0000 mg | ORAL_TABLET | Freq: Four times a day (QID) | ORAL | Status: DC
  Administered 2016-11-10 – 2016-11-12 (×7): 600 mg via ORAL
  Filled 2016-11-10 (×7): qty 1

## 2016-11-10 MED ORDER — TERBUTALINE SULFATE 1 MG/ML IJ SOLN
0.2500 mg | Freq: Once | INTRAMUSCULAR | Status: DC | PRN
  Filled 2016-11-10: qty 1

## 2016-11-10 MED ORDER — SOD CITRATE-CITRIC ACID 500-334 MG/5ML PO SOLN: 30.0000 mL | ORAL | Status: DC | PRN

## 2016-11-10 MED ORDER — LACTATED RINGERS IV SOLN
INTRAVENOUS | Status: DC
  Administered 2016-11-10: 125 mL/h via INTRAVENOUS

## 2016-11-10 MED ORDER — LIDOCAINE HCL (PF) 1 % IJ SOLN
30.0000 mL | INTRAMUSCULAR | Status: DC | PRN
  Filled 2016-11-10: qty 30

## 2016-11-10 MED ORDER — OXYTOCIN 40 UNITS IN LACTATED RINGERS INFUSION - SIMPLE MED
2.5000 [IU]/h | INTRAVENOUS | Status: DC
  Filled 2016-11-10: qty 1000

## 2016-11-10 MED ORDER — PENICILLIN G POT IN DEXTROSE 60000 UNIT/ML IV SOLN
3.0000 10*6.[IU] | INTRAVENOUS | Status: DC
  Administered 2016-11-10: 3 10*6.[IU] via INTRAVENOUS
  Filled 2016-11-10 (×5): qty 50

## 2016-11-10 MED ORDER — MISOPROSTOL 25 MCG QUARTER TABLET
25.0000 ug | ORAL_TABLET | ORAL | Status: DC | PRN
  Administered 2016-11-10 (×2): 25 ug via VAGINAL
  Filled 2016-11-10 (×3): qty 1

## 2016-11-10 MED ORDER — OXYTOCIN BOLUS FROM INFUSION
500.0000 mL | Freq: Once | INTRAVENOUS | Status: AC
  Administered 2016-11-10: 500 mL via INTRAVENOUS

## 2016-11-10 MED ORDER — OXYCODONE-ACETAMINOPHEN 5-325 MG PO TABS: 1.0000 | ORAL_TABLET | ORAL | Status: DC | PRN

## 2016-11-10 MED ORDER — PENICILLIN G POTASSIUM 5000000 UNITS IJ SOLR
5.0000 10*6.[IU] | Freq: Once | INTRAVENOUS | Status: AC
  Administered 2016-11-10: 5 10*6.[IU] via INTRAVENOUS
  Filled 2016-11-10: qty 5

## 2016-11-10 MED ORDER — FENTANYL CITRATE (PF) 100 MCG/2ML IJ SOLN
50.0000 ug | INTRAMUSCULAR | Status: DC | PRN
  Administered 2016-11-10: 100 ug via INTRAVENOUS
  Administered 2016-11-10: 50 ug via INTRAVENOUS
  Filled 2016-11-10 (×2): qty 2

## 2016-11-10 MED ORDER — ONDANSETRON HCL 4 MG/2ML IJ SOLN: 4.0000 mg | Freq: Four times a day (QID) | INTRAMUSCULAR | Status: DC | PRN

## 2016-11-10 MED ORDER — LACTATED RINGERS IV SOLN: 500.0000 mL | INTRAVENOUS | Status: DC | PRN

## 2016-11-10 MED ORDER — OXYCODONE-ACETAMINOPHEN 5-325 MG PO TABS: 2.0000 | ORAL_TABLET | ORAL | Status: DC | PRN

## 2016-11-10 MED ORDER — ACETAMINOPHEN 325 MG PO TABS: 650.0000 mg | ORAL_TABLET | ORAL | Status: DC | PRN

## 2016-11-10 NOTE — Progress Notes (Signed)
Labor Progress Note  S: resting comfortably.  O:  BP (!) 151/96   Pulse 93   Temp 99.4 F (37.4 C) (Axillary)   Resp 18   Ht  (1.702 m)   Wt 91.2 kg (201 lb)   LMP 02/15/2016 (Exact Date)   BMI 31.48 kg/m  EFM: 155, moderate variability, +accels, no decels NGE:XBMWUXLK: 1 Effacement (%): 50 Cervical Position: Posterior Station: -3 Presentation: Vertex Exam by:: rebecca Emilly Lavey cnm   A&P: 21 y.o. G1P0 [redacted]w[redacted]d IOL  #Labor:foley bulb, AROM #FWB: Category 1 #GBS: positive # gHTN: preE labs negative  Durenda Hurt, MD Resident Physician 3:46 PM

## 2016-11-10 NOTE — Anesthesia Pain Management Evaluation Note (Signed)
  CRNA Pain Management Visit Note  Patient: Kathleen Zhang, 21 y.o., female  "Hello I am a member of the anesthesia team at [facility]. We have an anesthesia team available at all times to provide care throughout the hospital, including epidural management and anesthesia for C-section. I don't know your plan for the delivery whether it a natural birth, water birth, IV sedation, nitrous supplementation, doula or epidural, but we want to meet your pain goals."   1.Was your pain managed to your expectations on prior hospitalizations?   No prior hospitalizations  2.What is your expectation for pain management during this hospitalization?     Epidural  3.How can we help you reach that goal? Epidural when ready.  Record the patient's initial score and the patient's pain goal.   Pain: 0  Pain Goal: 6 [facility] wants you to be able to say your pain was always managed very well.  Kathleen Zhang 11/10/2016

## 2016-11-10 NOTE — Progress Notes (Signed)
Patient with elevated BP again today without complaints of headaches, visual changes, RUQ/epigastric pain. Patient with diagnosis of gestational hypertension. Plan for induction of labor today. Birthing suite and on call attending has been informed of the patient's arrival

## 2016-11-10 NOTE — H&P (Signed)
I confirm that I have verified the information documented in the resident's note and that I have also personally reperformed the physical exam and all medical decision making activities.  Kenard Gower, CNM 11/10/2016 2:29 PM    OBSTETRIC ADMISSION HISTORY AND PHYSICAL  Kathleen Zhang is a 21 y.o. female G1P0 with IUP at [redacted]w[redacted]d by LMP c/w 18 week [place] presenting for IOL for gHTN. She reports +FMs, No LOF, no VB, no blurry vision, headaches or peripheral edema, and RUQ pain.  She plans on breast feeding. She is undecided about birth control.  Dating: By LMP c/w 18 week [place] --->  Estimated Date of Delivery: 11/21/16  Prenatal History/Complications:  Past Medical History: Past Medical History:  Diagnosis Date  . Medical history non-contributory     Past Surgical History: Past Surgical History:  Procedure Laterality Date  . NO PAST SURGERIES      Obstetrical History: OB History    Gravida Para Term Preterm AB Living   1             SAB TAB Ectopic Multiple Live Births                  Social History: Social History   Social History  . Marital status: Single    Spouse name: N/A  . Number of children: N/A  . Years of education: N/A   Social History Main Topics  . Smoking status: Never Smoker  . Smokeless tobacco: Never Used  . Alcohol use No  . Drug use: No  . Sexual activity: Yes    Birth control/ protection: None   Other Topics Concern  . None   Social History Narrative  . None    Family History: History reviewed. No pertinent family history.  Allergies: No Known Allergies  Prescriptions Prior to Admission  Medication Sig Dispense Refill Last Dose  . Prenat-FeCbn-FeAspGl-FA-Omega (OB COMPLETE PETITE) 35-5-1-200 MG CAPS Take 1 tablet by mouth daily. 30 capsule 12 11/10/2016 at Unknown time     Review of Systems   All systems reviewed and negative except as stated in HPI  Blood pressure (!) 145/83, pulse 88, temperature 98.8 F (37.1 C), temperature  source Oral, resp. rate 18, height  (1.702 m), weight 91.2 kg (201 lb), last menstrual period 02/15/2016, unknown if currently breastfeeding. General appearance: alert, cooperative and no distress Lungs: clear to auscultation bilaterally Heart: regular rate and rhythm Abdomen: soft, non-tender; bowel sounds normal Extremities: Homans sign is negative, no sign of DVT Presentation: cephalic Fetal monitoringBaseline: 150 bpm, Variability: Good {> 6 bpm), Accelerations: Reactive and Decelerations: Absent Uterine activityFrequency: occasional Dilation: Fingertip Effacement (%): Thick Station: -3 Exam by:: Ephraim Hamburger CNM   Prenatal labs: ABO, Rh: --/--/B POS (04/23 1128) Antibody: NEG (04/23 1128) Rubella: immune RPR: Non Reactive (02/07 1040)  HBsAg: NEGATIVE (08/24 1047)  HIV: Non Reactive (02/07 1040)  GBS: Positive (04/07 0000)  2 hr Glucola: 112/77/104 Genetic screening: normal Anatomy US fetal renal cysts  Prenatal Transfer Tool  Maternal Diabetes: No Genetic Screening: Normal Maternal Ultrasounds/Referrals: Abnormal:  Findings:   Fetal Kidney Anomalies Fetal Ultrasounds or other Referrals:  None Maternal Substance Abuse:  No Significant Maternal Medications:  None Significant Maternal Lab Results: None  Results for orders placed or performed during the hospital encounter of 11/10/16 (from the past 24 hour(s))  CBC   Collection Time: 11/10/16 11:28 AM  Result Value Ref Range   WBC 6.2 4.0 - 10.5 K/uL   RBC 4.24 3.87 -  5.11 MIL/uL   Hemoglobin 12.7 12.0 - 15.0 g/dL   HCT 16.1 09.6 - 04.5 %   MCV 88.7 78.0 - 100.0 fL   MCH 30.0 26.0 - 34.0 pg   MCHC 33.8 30.0 - 36.0 g/dL   RDW 40.9 81.1 - 91.4 %   Platelets 169 150 - 400 K/uL  Comprehensive metabolic panel   Collection Time: 11/10/16 11:28 AM  Result Value Ref Range   Sodium 134 (L) 135 - 145 mmol/L   Potassium 4.2 3.5 - 5.1 mmol/L   Chloride 105 101 - 111 mmol/L   CO2 21 (L) 22 - 32 mmol/L   Glucose,  Bld 118 (H) 65 - 99 mg/dL   BUN 9 6 - 20 mg/dL   Creatinine, Ser 7.82 0.44 - 1.00 mg/dL   Calcium 9.3 8.9 - 95.6 mg/dL   Total Protein 7.2 6.5 - 8.1 g/dL   Albumin 3.3 (L) 3.5 - 5.0 g/dL   AST 33 15 - 41 U/L   ALT 25 14 - 54 U/L   Alkaline Phosphatase 142 (H) 38 - 126 U/L   Total Bilirubin 0.3 0.3 - 1.2 mg/dL   GFR calc non Af Amer >60 >60 mL/min   GFR calc Af Amer >60 >60 mL/min   Anion gap 8 5 - 15  Type and screen   Collection Time: 11/10/16 11:28 AM  Result Value Ref Range   ABO/RH(D) B POS    Antibody Screen NEG    Sample Expiration 11/13/2016   Protein / creatinine ratio, urine   Collection Time: 11/10/16 12:05 PM  Result Value Ref Range   Creatinine, Urine 121.00 mg/dL   Total Protein, Urine 25 mg/dL   Protein Creatinine Ratio 0.21 (H) 0.00 - 0.15 mg/mg[Cre]    Patient Active Problem List   Diagnosis Date Noted  . Gestational hypertension 11/10/2016  . Group B Streptococcus carrier, +RV culture, currently pregnant 10/30/2016  . Fetal renal anomaly 10/15/2016  . Supervision of normal first pregnancy 06/04/2016    Assessment: Kathleen Zhang is a 21 y.o. G1P0 at [redacted]w[redacted]d here for IOL for gHTN  #Labor:cytotec, 2nd dose; consider addition of foley bulb at next check #Pain: Epidural upon request #FWB: Category 1 tracing #ID:  GBS positive, PCN in active labor #MOF: breast #MOC:undecided #Circ:  n/a, female #gHTN: PreE labs negative UPC 0.21  Durenda Hurt, MD 11/10/2016, 2:03 PM

## 2016-11-11 LAB — RPR: RPR: NONREACTIVE

## 2016-11-11 MED ORDER — TETANUS-DIPHTH-ACELL PERTUSSIS 5-2.5-18.5 LF-MCG/0.5 IM SUSP: 0.5000 mL | Freq: Once | INTRAMUSCULAR | Status: DC

## 2016-11-11 MED ORDER — ZOLPIDEM TARTRATE 5 MG PO TABS: 5.0000 mg | ORAL_TABLET | Freq: Every evening | ORAL | Status: DC | PRN

## 2016-11-11 MED ORDER — AMLODIPINE BESYLATE 5 MG PO TABS
5.0000 mg | ORAL_TABLET | Freq: Every day | ORAL | Status: DC
  Administered 2016-11-11 – 2016-11-12 (×2): 5 mg via ORAL
  Filled 2016-11-11 (×3): qty 1

## 2016-11-11 MED ORDER — BENZOCAINE-MENTHOL 20-0.5 % EX AERO: 1.0000 "application " | INHALATION_SPRAY | CUTANEOUS | Status: DC | PRN

## 2016-11-11 MED ORDER — ACETAMINOPHEN 325 MG PO TABS: 650.0000 mg | ORAL_TABLET | ORAL | Status: DC | PRN

## 2016-11-11 MED ORDER — DIBUCAINE 1 % RE OINT: 1.0000 "application " | TOPICAL_OINTMENT | RECTAL | Status: DC | PRN

## 2016-11-11 MED ORDER — ONDANSETRON HCL 4 MG PO TABS: 4.0000 mg | ORAL_TABLET | ORAL | Status: DC | PRN

## 2016-11-11 MED ORDER — PRENATAL MULTIVITAMIN CH
1.0000 | ORAL_TABLET | Freq: Every day | ORAL | Status: DC
  Administered 2016-11-11 – 2016-11-12 (×2): 1 via ORAL
  Filled 2016-11-11 (×2): qty 1

## 2016-11-11 MED ORDER — DIPHENHYDRAMINE HCL 25 MG PO CAPS: 25.0000 mg | ORAL_CAPSULE | Freq: Four times a day (QID) | ORAL | Status: DC | PRN

## 2016-11-11 MED ORDER — ONDANSETRON HCL 4 MG/2ML IJ SOLN: 4.0000 mg | INTRAMUSCULAR | Status: DC | PRN

## 2016-11-11 MED ORDER — SIMETHICONE 80 MG PO CHEW: 80.0000 mg | CHEWABLE_TABLET | ORAL | Status: DC | PRN

## 2016-11-11 MED ORDER — WITCH HAZEL-GLYCERIN EX PADS: 1.0000 "application " | MEDICATED_PAD | CUTANEOUS | Status: DC | PRN

## 2016-11-11 MED ORDER — SENNOSIDES-DOCUSATE SODIUM 8.6-50 MG PO TABS
2.0000 | ORAL_TABLET | ORAL | Status: DC
  Administered 2016-11-11: 2 via ORAL
  Filled 2016-11-11: qty 2

## 2016-11-11 MED ORDER — COCONUT OIL OIL: 1.0000 "application " | TOPICAL_OIL | Status: DC | PRN

## 2016-11-11 NOTE — Lactation Note (Signed)
This note was copied from a baby's chart. Lactation Consultation Note: Lactation brochure given to mother. Mother quickly reports that she is going to try to breastfeed but if too difficult she plans to bottle feed infant. Mother offered to pump and bottle feed. If mother desires to be sat up with a DEBP or she needs assistance to latch infant , she will page for staff nurse or lactation to assist. Reviewed LC services and community support.   Patient Name: Kathleen Zhang [id number] Date: 11/11/2016 Reason for consult: Initial assessment   Maternal Data Has patient been taught Hand Expression?: Yes (by staff member) Does the patient have breastfeeding experience prior to this delivery?: No  Feeding Feeding Type: Formula Nipple Type: Slow - flow  LATCH Score/Interventions                      Lactation Tools Discussed/Used     Consult Status Consult Status: Follow-up Date: 11/11/16 Follow-up type: In-patient    Stevan Born [facility] 11/11/2016, 11:20 AM

## 2016-11-11 NOTE — Progress Notes (Signed)
Post Partum Day #1 Subjective: up ad lib, voiding, tolerating PO and reports difficulty with breast feeding.  Objective: Blood pressure (!) 146/64, pulse 90, temperature 98.2 F (36.8 C), temperature source Oral, resp. rate 16, height  (1.702 m), weight 201 lb (91.2 kg), last menstrual period 02/15/2016, SpO2 100 %, unknown if currently breastfeeding.  Physical Exam:  General: alert, cooperative and no distress Lochia: appropriate Uterine Fundus: firm Incision: none DVT Evaluation: No evidence of DVT seen on physical exam. No cords or calf tenderness. No significant calf/ankle edema.   Recent Labs  11/10/16 1128  HGB 12.7  HCT 37.6    Assessment/Plan: Plan for discharge tomorrow, Breastfeeding, Lactation consult and Contraception POP vs OCP   LOS: 1 day   Roe Coombs, CNM 11/11/2016, 7:19 AM

## 2016-11-12 MED ORDER — AMLODIPINE BESYLATE 5 MG PO TABS: 5.0000 mg | ORAL_TABLET | Freq: Every day | ORAL | 12 refills | Status: DC

## 2016-11-12 MED ORDER — IBUPROFEN 600 MG PO TABS: 600.0000 mg | ORAL_TABLET | Freq: Four times a day (QID) | ORAL | 2 refills | Status: DC

## 2016-11-12 NOTE — Progress Notes (Signed)
Post Partum Day #2 Subjective: no complaints, up ad lib, voiding, tolerating PO, + flatus and + BM  Objective: Blood pressure 126/65, pulse 79, temperature 98 F (36.7 C), resp. rate 18, height  (1.702 m), weight 201 lb (91.2 kg), last menstrual period 02/15/2016, SpO2 100 %, unknown if currently breastfeeding.  Physical Exam:  General: alert, cooperative and no distress Lochia: appropriate Uterine Fundus: firm Incision: 1st degree healing DVT Evaluation: No evidence of DVT seen on physical exam. No cords or calf tenderness. No significant calf/ankle edema.   Recent Labs  11/10/16 1128  HGB 12.7  HCT 37.6    Assessment/Plan: Discharge home, Breastfeeding and Contraception undecided   LOS: 2 days   Roe Coombs, CNM 11/12/2016, 7:16 AM

## 2016-11-12 NOTE — Discharge Summary (Signed)
OB Discharge Summary     Patient Name: Kathleen Zhang DOB: 08/23/1995 MRN: [id number]  Date of admission: 11/10/2016 Delivering MD: Lovena Neighbours   Date of discharge: 11/12/2016  Admitting diagnosis: INDUCTION Intrauterine pregnancy: [redacted]w[redacted]d     Secondary diagnosis:  Active Problems:   Gestational hypertension  Additional problems: none     Discharge diagnosis: Term Pregnancy Delivered and Gestational Hypertension                                                                                                Post partum procedures:none  Augmentation: AROM  Complications: None  Hospital course:  Onset of Labor With Vaginal Delivery     21 y.o. yo G1P1001 at [redacted]w[redacted]d was admitted in Latent Labor on 11/10/2016. Patient had an uncomplicated labor course as follows:  Membrane Rupture Time/Date: 3:30 PM ,11/10/2016   Intrapartum Procedures: Episiotomy: None [1]                                         Lacerations:  1st degree [2]  Patient had a delivery of a Viable infant. 11/10/2016  Information for the patient's newborn:  [place], [place] [[id number]]  Delivery Method: Vaginal, Spontaneous Delivery (Filed from Delivery Summary)    Pateint had an uncomplicated postpartum course.  She is ambulating, tolerating a regular diet, passing flatus, and urinating well. Patient is discharged home in stable condition on 11/12/16.   Physical exam  Vitals:   11/11/16 0115 11/11/16 0528 11/11/16 0948 11/12/16 0544  BP: 140/69 (!) 146/64 (!) 133/55 126/65  Pulse: 87 90  79  Resp: Temp: 98.1 F (36.7 C) 98.2 F (36.8 C)  98 F (36.7 C)  TempSrc: Oral Oral    SpO2: 99% 100%    Weight:      Height:       General: alert, cooperative and no distress Lochia: appropriate Uterine Fundus: firm Incision: N/A DVT Evaluation: No evidence of DVT seen on physical exam. No cords or calf tenderness. No significant calf/ankle edema. Labs: Lab Results  Component Value Date   WBC 6.2  11/10/2016   HGB 12.7 11/10/2016   HCT 37.6 11/10/2016   MCV 88.7 11/10/2016   PLT 169 11/10/2016   CMP Latest Ref Rng & Units 11/10/2016  Glucose 65 - 99 mg/dL 086(V)  BUN 6 - 20 mg/dL 9  Creatinine 7.84 - 6.96 mg/dL 2.95  Sodium 284 - 132 mmol/L 134(L)  Potassium 3.5 - 5.1 mmol/L 4.2  Chloride 101 - 111 mmol/L 105  CO2 22 - 32 mmol/L 21(L)  Calcium 8.9 - 10.3 mg/dL 9.3  Total Protein 6.5 - 8.1 g/dL 7.2  Total Bilirubin 0.3 - 1.2 mg/dL 0.3  Alkaline Phos 38 - 126 U/L 142(H)  AST 15 - 41 U/L 33  ALT 14 - 54 U/L 25    Discharge instruction: per After Visit Summary and "Baby and Me Booklet".  After visit meds:  Allergies as of 11/12/2016   No Known  Allergies     Medication List    TAKE these medications   amLODipine 5 MG tablet Commonly known as:  NORVASC Take 1 tablet (5 mg total) by mouth daily.   ibuprofen 600 MG tablet Commonly known as:  ADVIL,MOTRIN Take 1 tablet (600 mg total) by mouth every 6 (six) hours.   OB COMPLETE PETITE 35-5-1-200 MG Caps Take 1 tablet by mouth daily.       Diet: routine diet  Activity: Advance as tolerated. Pelvic rest for 6 weeks.   Outpatient follow up:4 weeks Follow up Appt:No future appointments. Follow up Visit:No Follow-up on file.  Postpartum contraception: Undecided  Newborn Data: Live born female  Birth Weight: 6 lb 13.5 oz (3104 g) APGAR: 7, 9  Baby Feeding: Breast Disposition:home with mother   11/12/2016 Roe Coombs, CNM

## 2016-11-13 ENCOUNTER — Encounter: Payer: Medicaid Other | Admitting: Obstetrics and Gynecology

## 2016-11-26 ENCOUNTER — Telehealth: Payer: Self-pay | Admitting: *Deleted

## 2016-11-26 NOTE — Telephone Encounter (Signed)
Pt called to office to ask about her BP medication.  Pt states that she stopped taking her medication 5 days ago. Pt made aware that BP meds are usually taken until PP appt where it would be discuss if needed to continue.  Pt states she has no way of currently checking her BP. Pt would like to know if she was to continue taking or is she ok to not take now that she has stopped. Pt advised message sent to provider for review.    Please advise on BP meds.

## 2016-11-26 NOTE — Telephone Encounter (Signed)
We need to check her blood pressure.  Thank you.  R.Barnet Benavides CNM

## 2016-11-26 NOTE — Telephone Encounter (Signed)
Pt made aware and has been scheduled for Tomorrow.

## 2016-11-27 ENCOUNTER — Ambulatory Visit (INDEPENDENT_AMBULATORY_CARE_PROVIDER_SITE_OTHER): Payer: Medicaid Other | Admitting: Certified Nurse Midwife

## 2016-11-27 VITALS — BP 120/90 | HR 96

## 2016-11-27 DIAGNOSIS — Z0130 Encounter for examination of blood pressure without abnormal findings: Secondary | ICD-10-CM

## 2016-11-27 NOTE — Progress Notes (Signed)
Normotensive.  No treatment indicated at this time.  R.Jezreel Sisk CNM

## 2016-11-27 NOTE — Progress Notes (Signed)
Patient was using BP medication after delivery. She has been off medications for 6 days and she is in the office for check. [facility] and patient is good to follow up at her post partum.

## 2016-12-03 ENCOUNTER — Encounter: Payer: Self-pay | Admitting: Gynecology

## 2016-12-22 ENCOUNTER — Encounter: Payer: Self-pay | Admitting: Obstetrics & Gynecology

## 2016-12-22 ENCOUNTER — Ambulatory Visit (INDEPENDENT_AMBULATORY_CARE_PROVIDER_SITE_OTHER): Payer: Medicaid Other | Admitting: Obstetrics & Gynecology

## 2016-12-22 DIAGNOSIS — Z30011 Encounter for initial prescription of contraceptive pills: Secondary | ICD-10-CM

## 2016-12-22 MED ORDER — NORGESTIM-ETH ESTRAD TRIPHASIC 0.18/0.215/0.25 MG-35 MCG PO TABS: 1.0000 | ORAL_TABLET | Freq: Every day | ORAL | 11 refills | Status: DC

## 2016-12-22 NOTE — Patient Instructions (Signed)
Oral Contraception Use Oral contraceptive pills (OCPs) are medicines taken to prevent pregnancy. OCPs work by preventing the ovaries from releasing eggs. The hormones in OCPs also cause the cervical mucus to thicken, preventing the sperm from entering the uterus. The hormones also cause the uterine lining to become thin, not allowing a fertilized egg to attach to the inside of the uterus. OCPs are highly effective when taken exactly as prescribed. However, OCPs do not prevent sexually transmitted diseases (STDs). Safe sex practices, such as using condoms along with an OCP, can help prevent STDs. Before taking OCPs, you may have a physical exam and Pap test. Your health care provider may also order blood tests if necessary. Your health care provider will make sure you are a good candidate for oral contraception. Discuss with your health care provider the possible side effects of the OCP you may be prescribed. When starting an OCP, it can take 2 to 3 months for the body to adjust to the changes in hormone levels in your body. How to take oral contraceptive pills Your health care provider may advise you on how to start taking the first cycle of OCPs. Otherwise, you can:  Start on day 1 of your menstrual period. You will not need any backup contraceptive protection with this start time.  Start on the first Sunday after your menstrual period or the day you get your prescription. In these cases, you will need to use backup contraceptive protection for the first week.  Start the pill at any time of your cycle. If you take the pill within 5 days of the start of your period, you are protected against pregnancy right away. In this case, you will not need a backup form of birth control. If you start at any other time of your menstrual cycle, you will need to use another form of birth control for 7 days. If your OCP is the type called a minipill, it will protect you from pregnancy after taking it for 2 days (48  hours).  After you have started taking OCPs:  If you forget to take 1 pill, take it as soon as you remember. Take the next pill at the regular time.  If you miss 2 or more pills, call your health care provider because different pills have different instructions for missed doses. Use backup birth control until your next menstrual period starts.  If you use a 28-day pack that contains inactive pills and you miss 1 of the last 7 pills (pills with no hormones), it will not matter. Throw away the rest of the non-hormone pills and start a new pill pack.  No matter which day you start the OCP, you will always start a new pack on that same day of the week. Have an extra pack of OCPs and a backup contraceptive method available in case you miss some pills or lose your OCP pack. Follow these instructions at home:  Do not smoke.  Always use a condom to protect against STDs. OCPs do not protect against STDs.  Use a calendar to mark your menstrual period days.  Read the information and directions that came with your OCP. Talk to your health care provider if you have questions. Contact a health care provider if:  You develop nausea and vomiting.  You have abnormal vaginal discharge or bleeding.  You develop a rash.  You miss your menstrual period.  You are losing your hair.  You need treatment for mood swings or depression.  You   get dizzy when taking the OCP.  You develop acne from taking the OCP.  You become pregnant. Get help right away if:  You develop chest pain.  You develop shortness of breath.  You have an uncontrolled or severe headache.  You develop numbness or slurred speech.  You develop visual problems.  You develop pain, redness, and swelling in the legs. This information is not intended to replace advice given to you by your health care provider. Make sure you discuss any questions you have with your health care provider. Document Released: 06/26/2011 Document  Revised: 12/13/2015 Document Reviewed: 12/26/2012 Elsevier Interactive Patient Education  2017 [employer]  

## 2016-12-22 NOTE — Progress Notes (Signed)
Post Partum Exam  Kathleen Zhang is a 21 y.o. 971P1001 female who presents for a postpartum visit. She is 6 weeks postpartum following a spontaneous vaginal delivery. I have fully reviewed the prenatal and intrapartum course. The delivery was at 38 gestational weeks.  Anesthesia: none. Postpartum course has been unremarkable. Baby's course has been unremarkable. Baby is feeding by bottle - Similac Advance. Bleeding no bleeding. Bowel function is normal. Bladder function is normal. Patient is not sexually active. Contraception method is none. Postpartum depression screening:neg  The following portions of the patient's history were reviewed and updated as appropriate: allergies, current medications, past family history, past medical history, past social history, past surgical history and problem list.  Review of Systems Pertinent items are noted in HPI.    Objective:  not currently breastfeeding.  General:  alert, cooperative and no distress           Abdomen: soft, non-tender; bowel sounds normal; no masses,  no organomegaly   Vulva:  not evaluated  Vagina: not evaluated  Cervix:     Corpus: not examined  Adnexa:  not evaluated  Rectal Exam: Not performed.        Assessment:    normal postpartum exam. Pap smear not done at today's visit.   Plan:   1. Contraception: OCP (estrogen/progesterone) 2. triSprintec Lo 3. Follow up as needed.   Adam PhenixArnold, James G, MD 12/22/2016

## 2017-01-01 ENCOUNTER — Telehealth: Payer: Self-pay

## 2017-01-01 DIAGNOSIS — N760 Acute vaginitis: Principal | ICD-10-CM

## 2017-01-01 DIAGNOSIS — B9689 Other specified bacterial agents as the cause of diseases classified elsewhere: Secondary | ICD-10-CM

## 2017-01-01 MED ORDER — METRONIDAZOLE 500 MG PO TABS: 500.0000 mg | ORAL_TABLET | Freq: Two times a day (BID) | ORAL | 0 refills | Status: AC

## 2017-01-01 NOTE — Telephone Encounter (Signed)
Pt called and states that she is having discharge with odor. Pt states that she would like a rx sent to pharmacy for BV. [facility]. Per protocol rx sent to pharmacy for Flagyl.

## 2017-03-15 IMAGING — US US MFM OB FOLLOW-UP
1 series · 14 of 28 positions shown · non-contrast
Comparison: none

[Series 1: us mfm ob follow-up · 75 acquisitions, 14 frames shown]
[im 3/75]
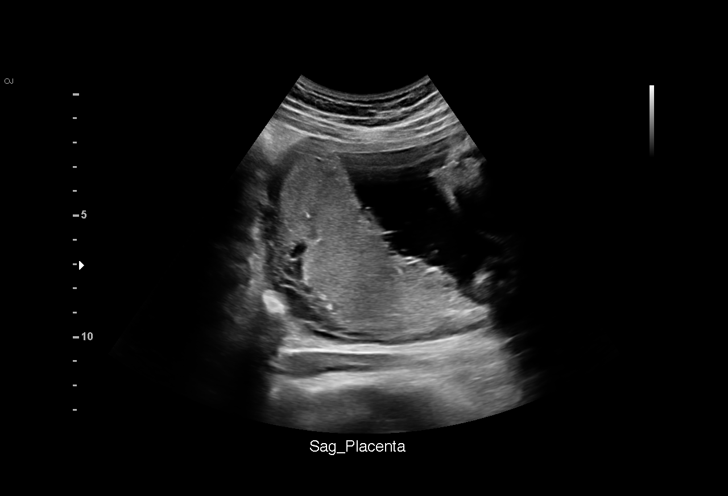
[im 9/75]
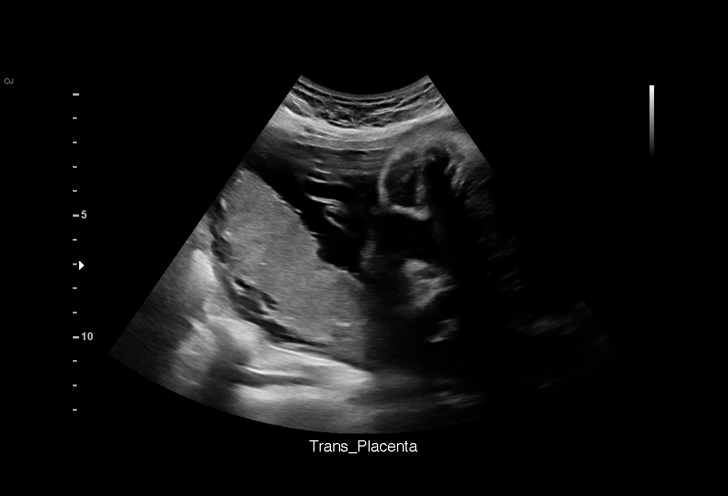
[im 14/75]
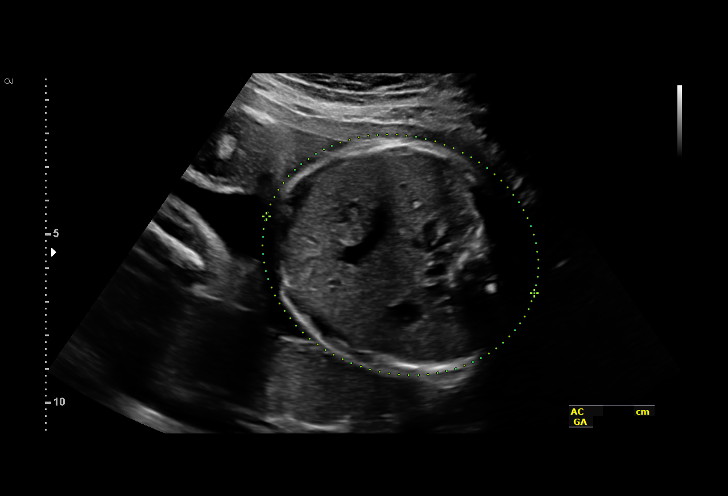
[im 20/75]
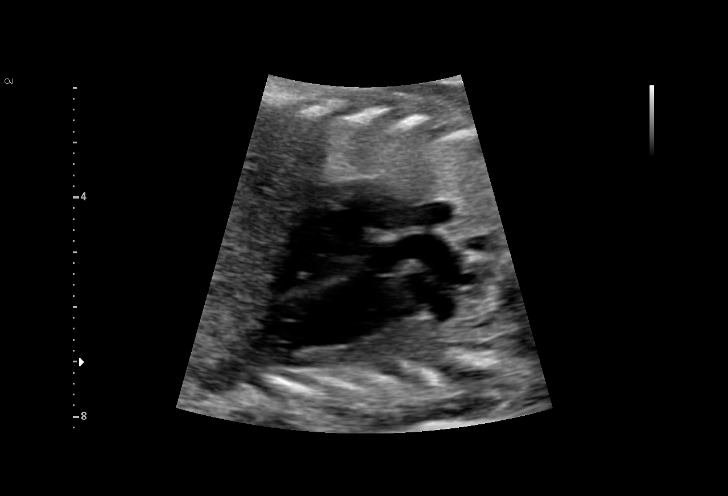
[im 25/75]
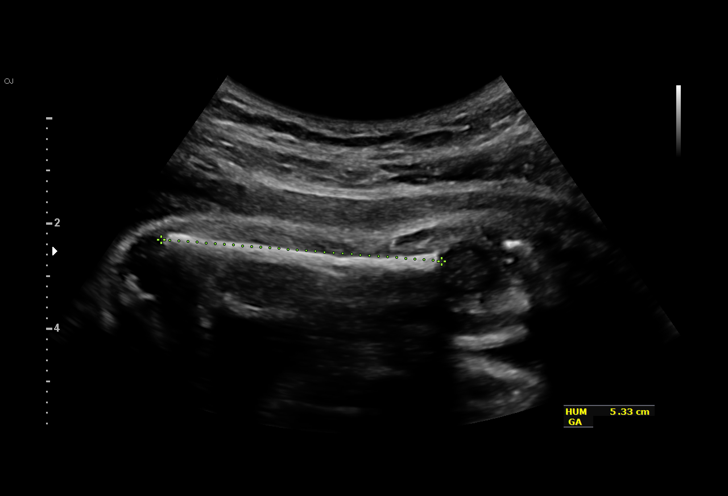
[im 31/75]
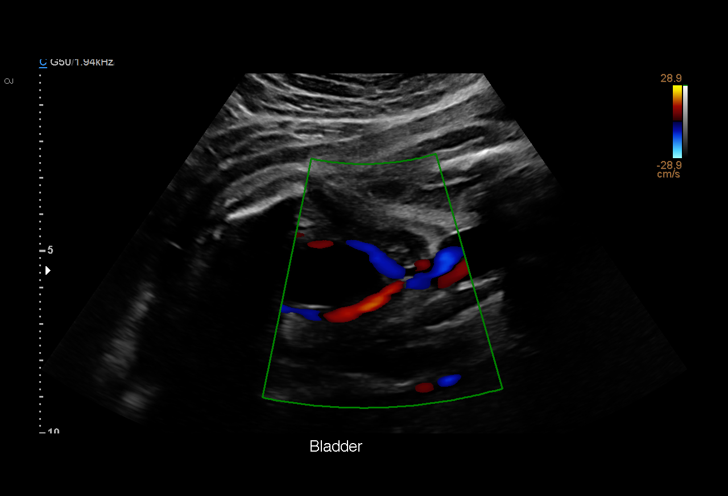
[im 36/75]
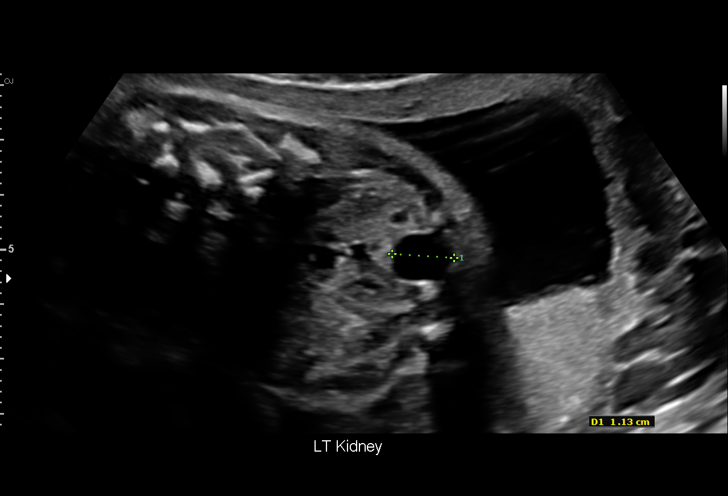
[im 42/75]
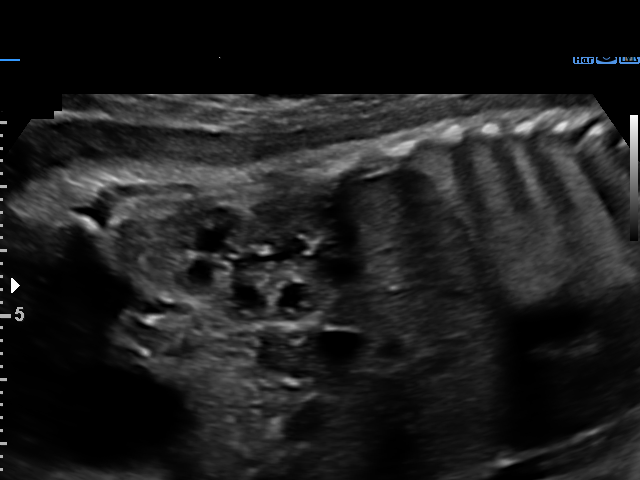
[im 47/75]
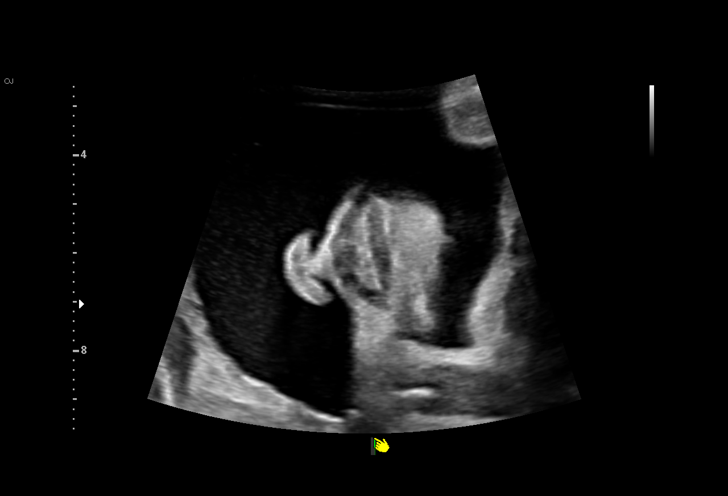
[im 53/75]
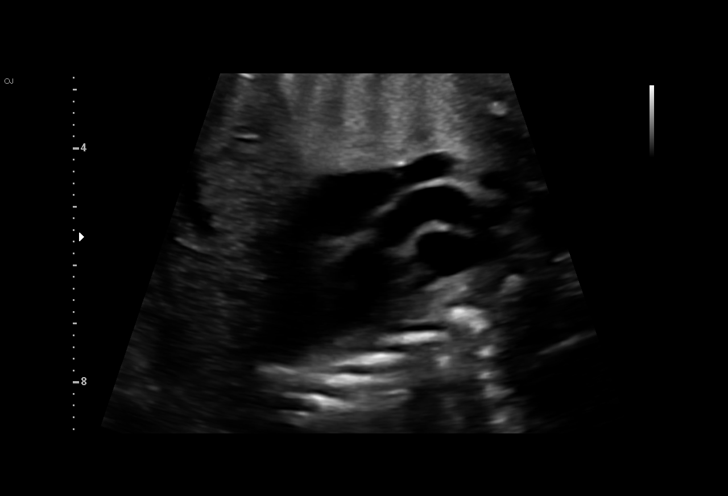
[im 58/75]
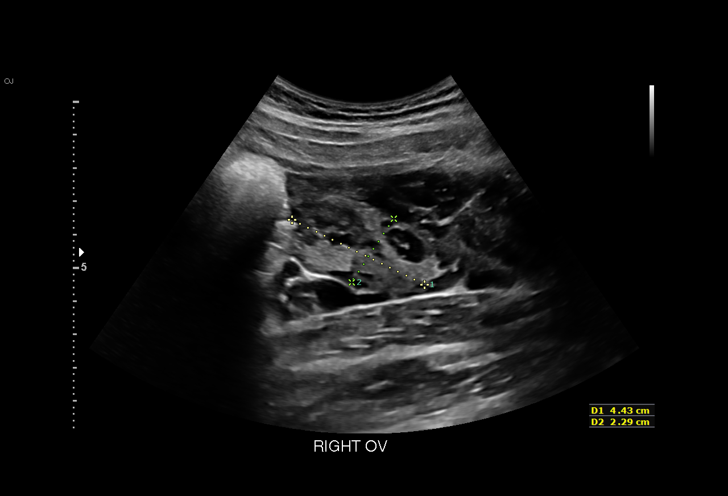
[im 64/75]
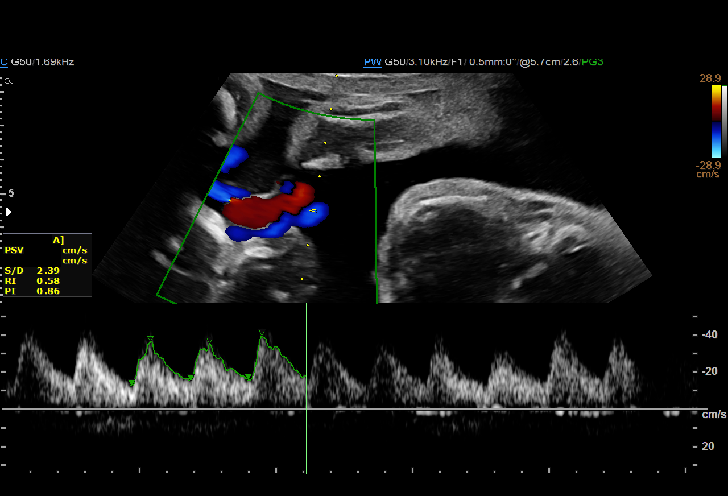
[im 69/75]
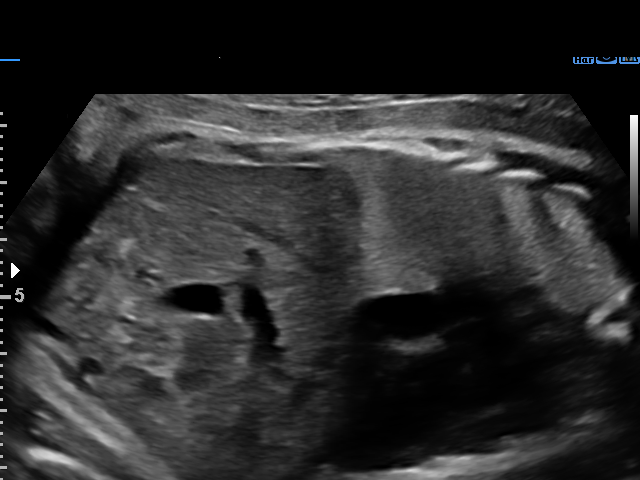
[im 75/75]
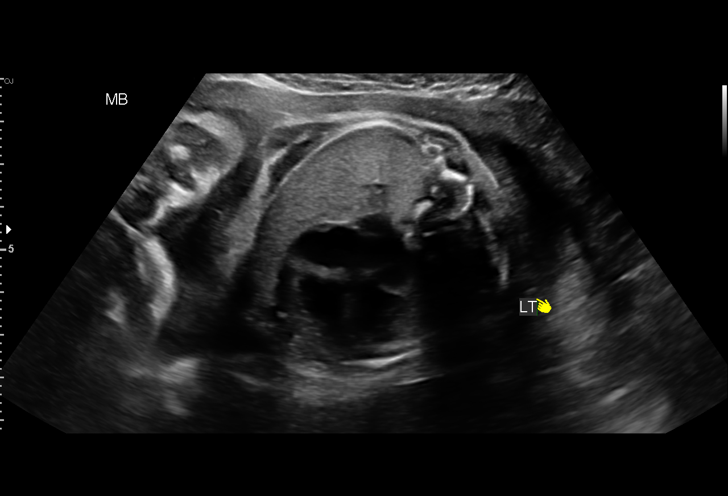

[14 of 28 positions shown; findings below may reference images not displayed]

Road [HOSPITAL]

1  BLADE AUJLA             [id number]      [id number]     [id number]
Indications

30 weeks gestation of pregnancy
Small for gestational age fetus affecting
management of mother
OB History

Blood Type:            Height:  5'7"   Weight (lb):  140      BMI:
Gravidity:    1         Term:   0        Prem:   0        SAB:   0
TOP:          0       Ectopic:  0        Living: 0
Fetal Evaluation

Num Of Fetuses:     1
Fetal Heart         136
Rate(bpm):
Cardiac Activity:   Observed
Presentation:       Cephalic
Placenta:           Posterior, above cervical os
P. Cord Insertion:  Visualized

Amniotic Fluid
AFI FV:      Subjectively within normal limits

AFI Sum(cm)     %Tile       Largest Pocket(cm)
13.94           45

RUQ(cm)       RLQ(cm)       LUQ(cm)        LLQ(cm)
1.99
Biometry
BPD:      76.2  mm     G. Age:  30w 4d         56  %    CI:        79.25   %   70 - 86
FL/HC:      22.7   %   19.2 -
HC:      270.6  mm     G. Age:  29w 4d          9  %    HC/AC:      1.12       0.99 -
AC:      240.6  mm     G. Age:  28w 3d          8  %    FL/BPD:     80.4   %   71 - 87
FL:       61.3  mm     G. Age:  31w 6d         83  %    FL/AC:      25.5   %   20 - 24
HUM:      54.4  mm     G. Age:  31w 4d         81  %
Est. FW:    8376  gm      3 lb 3 oz     49  %
Gestational Age

LMP:           30w 0d       Date:   02/15/16                 EDD:   11/21/16
U/S Today:     30w 1d                                        EDD:   11/20/16
Best:          30w 0d    Det. By:   LMP  (02/15/16)          EDD:   11/21/16
Anatomy

Cranium:               Appears normal         Aortic Arch:            Previously seen
Cavum:                 Previously seen        Ductal Arch:            Previously seen
Ventricles:            Appears normal         Diaphragm:              Previously seen
Choroid Plexus:        Previously seen        Stomach:                Appears normal, left
sided
Cerebellum:            Previously seen        Abdomen:                Appears normal
Posterior Fossa:       Previously seen        Abdominal Wall:         Previously seen
Nuchal Fold:           Not applicable (>20    Cord Vessels:           Appears normal (3
wks GA)                                        vessel cord)
Face:                  Orbits and profile     Kidneys:                Abnormal, see
previously seen
comments
Lips:                  Appears normal         Bladder:                Appears normal
Thoracic:              Appears normal         Spine:                  Previously seen
Heart:                 Appears normal         Upper Extremities:      Previously seen
(4CH, axis, and situs
RVOT:                  Appears normal         Lower Extremities:      Previously seen
LVOT:                  Previously seen

Other:  Fetus appears to be a female. Technically difficult due to fetal
position. TIGER noted
Doppler - Fetal Vessels

Umbilical Artery
S/D     %tile     RI              PI              PSV    ADFV    RDFV
(cm/s)
2.86       51   0.65             1.03             40.93      No      No

Impression

SIUP at 30+0 weeks
Normal interval anatomy; small, solitary cyst (7 x 9 x 11 mms)
located on periphery of left kidney
Normal amniotic fluid volume
Appropriate interval growth with EFW at the 49th %tile; AC at
the 8th %tile
(UA dopplers were normal for this GA)
Recommendations

Follow-up ultrasound for growth in 3 weeks

## 2017-04-04 IMAGING — US US MFM OB FOLLOW-UP
1 series · 14 of 28 positions shown · non-contrast
Comparison: none

[Series 1: us mfm ob follow-up · 55 acquisitions, 14 frames shown]
[im 3/55]
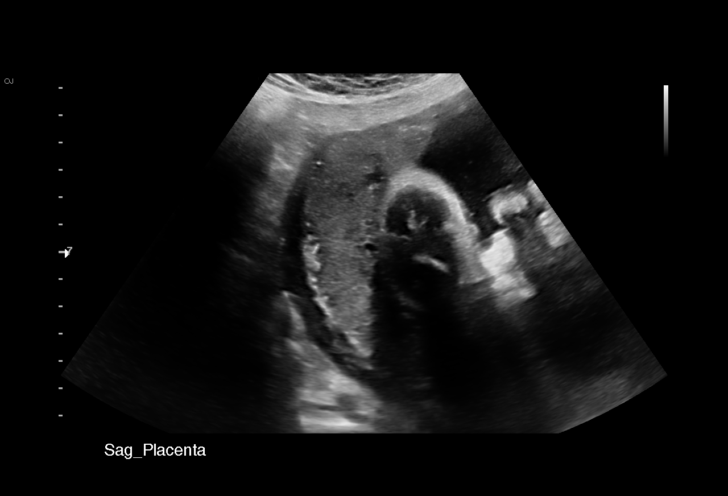
[im 7/55]
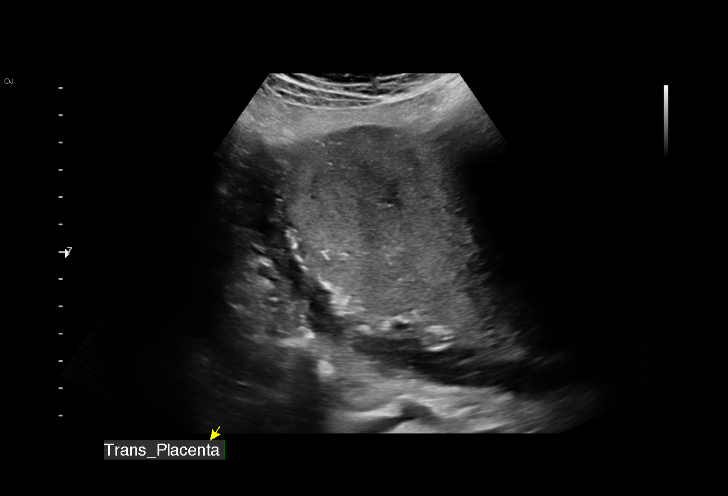
[im 11/55]
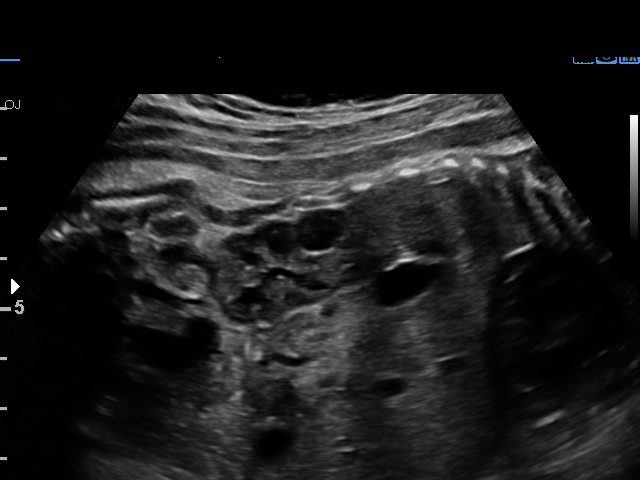
[im 15/55]
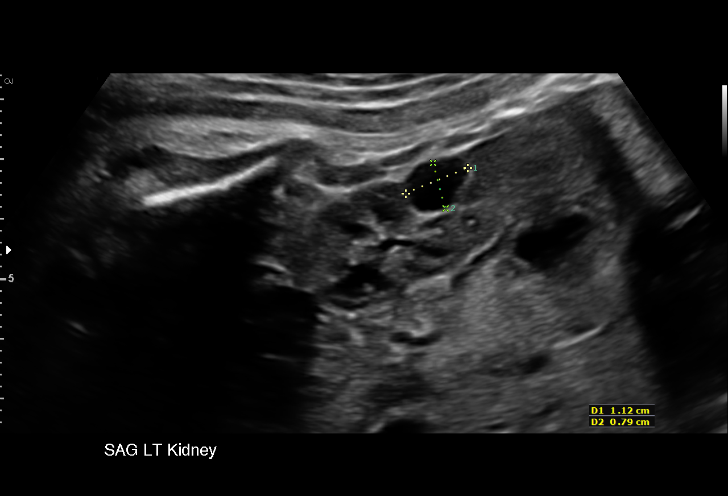
[im 19/55]
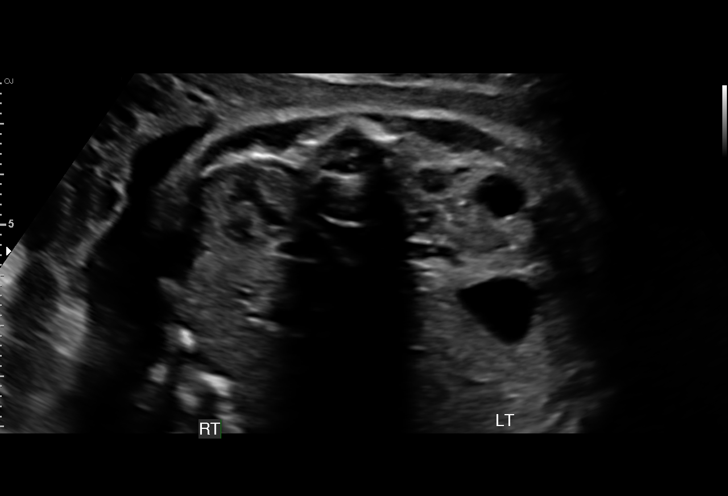
[im 23/55]
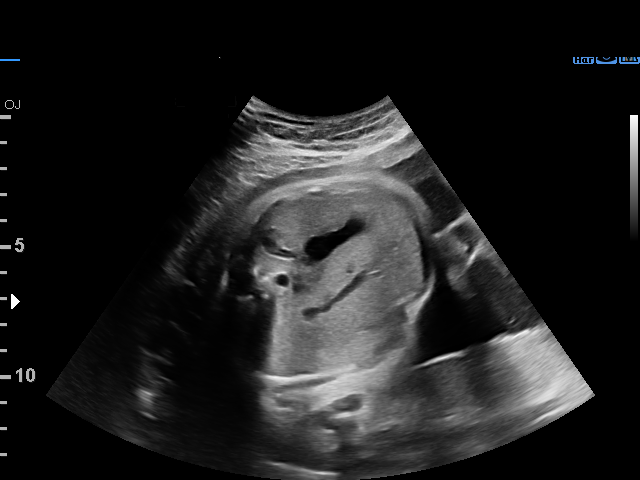
[im 27/55]
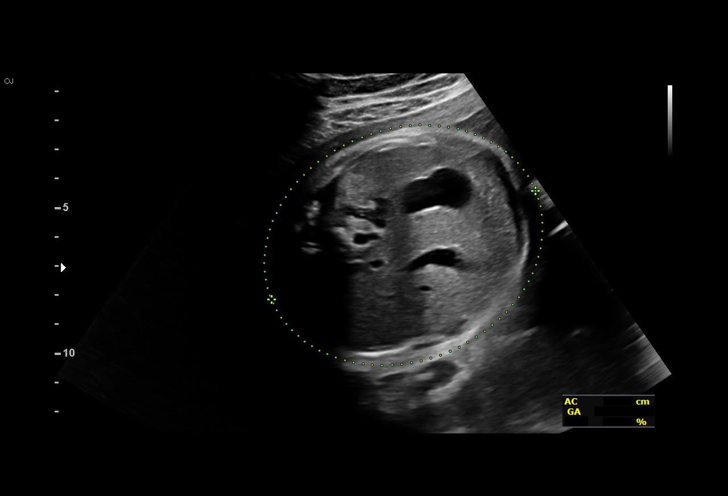
[im 31/55]
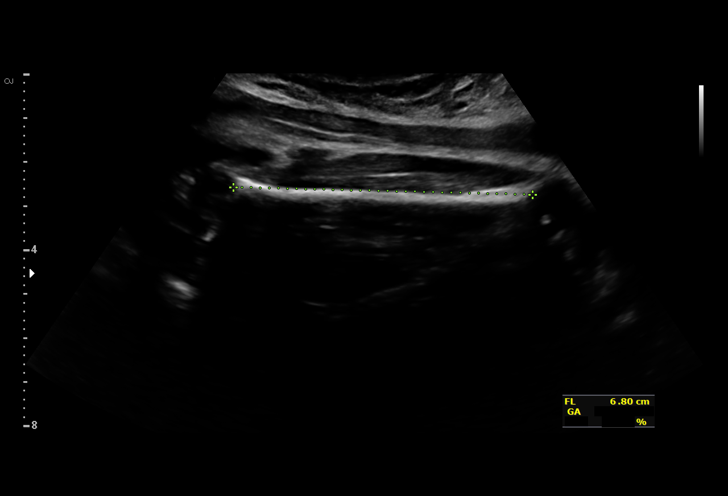
[im 35/55]
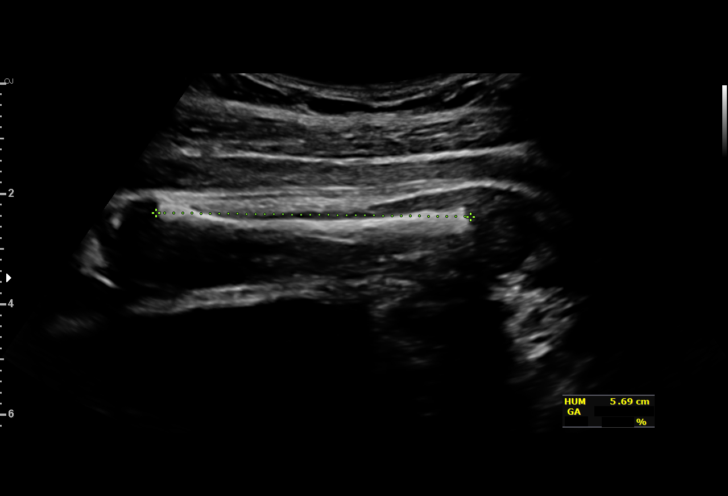
[im 39/55]
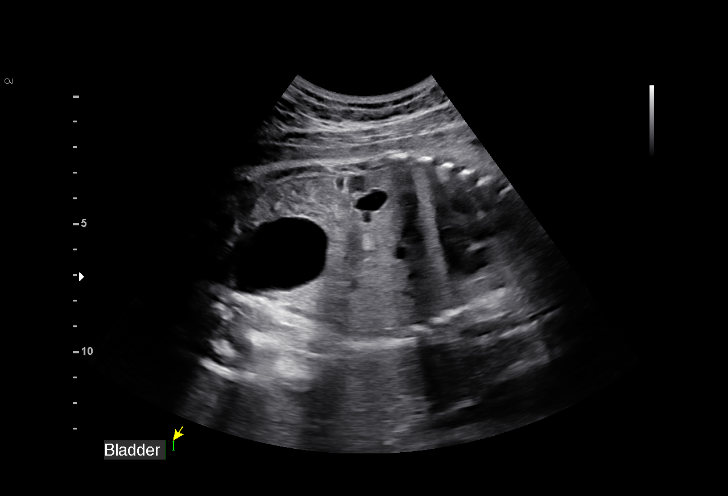
[im 43/55]
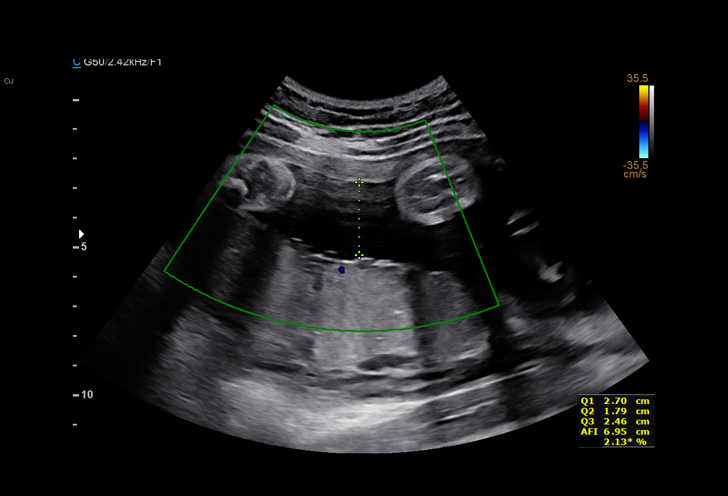
[im 47/55]
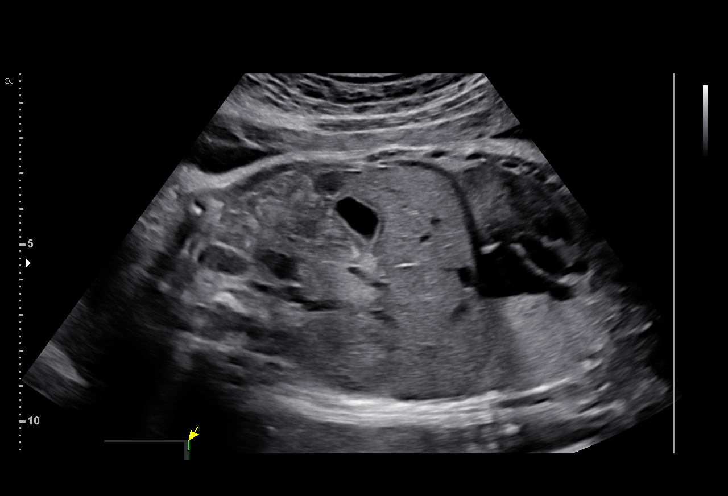
[im 51/55]
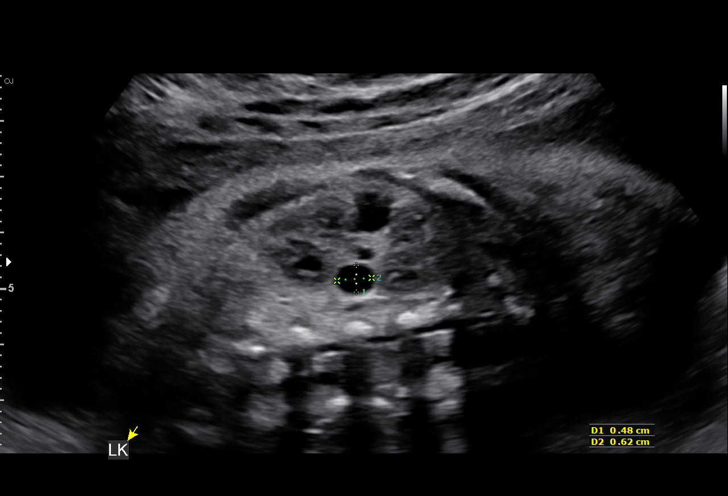
[im 55/55]
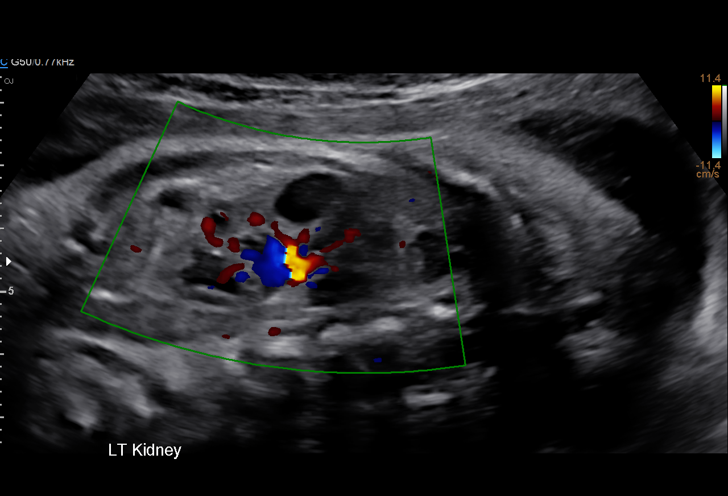

[14 of 28 positions shown; findings below may reference images not displayed]

Road [HOSPITAL]

1  QUIRIJN AMAZIGH            [id number]      [id number]     [id number]
Indications

32 weeks gestation of pregnancy
Maternal care for known or suspected poor
fetal growth, third trimester, not applicable or
unspecified
Fetal renal anomaly(simple cyst on ARPANDY)
OB History

Blood Type:            Height:  5'7"   Weight (lb):  140      BMI:
Gravidity:    1         Term:   0        Prem:   0        SAB:   0
TOP:          0       Ectopic:  0        Living: 0
Fetal Evaluation

Num Of Fetuses:     1
Fetal Heart         150
Rate(bpm):
Cardiac Activity:   Observed
Presentation:       Cephalic
Placenta:           Posterior, above cervical os
P. Cord Insertion:  Previously Visualized

Amniotic Fluid
AFI FV:      Subjectively within normal limits

AFI Sum(cm)     %Tile       Largest Pocket(cm)
10.02           18

RUQ(cm)       RLQ(cm)       LUQ(cm)        LLQ(cm)
2.7
Biometry

BPD:      78.4  mm     G. Age:  31w 3d         10  %    CI:         79.3   %   70 - 86
FL/HC:      24.6   %   19.9 -
HC:      278.3  mm     G. Age:  30w 3d        < 3  %    HC/AC:      1.01       0.96 -
AC:      276.3  mm     G. Age:  31w 5d         20  %    FL/BPD:     87.4   %   71 - 87
FL:       68.5  mm     G. Age:  35w 1d         91  %    FL/AC:      24.8   %   20 - 24
HUM:      57.2  mm     G. Age:  33w 1d         65  %

Est. FW:    7774  gm      4 lb 7 oz     52  %
Gestational Age

LMP:           32w 6d       Date:   02/15/16                 EDD:   11/21/16
U/S Today:     32w 1d                                        EDD:   11/26/16
Best:          32w 6d    Det. By:   LMP  (02/15/16)          EDD:   11/21/16
Anatomy

Cranium:               Appears normal         Aortic Arch:            Previously seen
Cavum:                 Previously seen        Ductal Arch:            Previously seen
Ventricles:            Appears normal         Diaphragm:              Previously seen
Choroid Plexus:        Previously seen        Stomach:                Appears normal, left
sided
Cerebellum:            Previously seen        Abdomen:                Appears normal
Posterior Fossa:       Previously seen        Abdominal Wall:         Previously seen
Nuchal Fold:           Not applicable (>20    Cord Vessels:           Appears normal (3
wks GA)                                        vessel cord)
Face:                  Orbits and profile     Kidneys:                Abnormal, see
previously seen
comments
Lips:                  Previously seen        Bladder:                Appears normal
Thoracic:              Appears normal         Spine:                  Previously seen
Heart:                 Appears normal         Upper Extremities:      Previously seen
(4CH, axis, and situs
RVOT:                  Previously seen        Lower Extremities:      Previously seen
LVOT:                  Previously seen

Other:  Fetus appears to be a female. Technically difficult due to fetal position.
Cervix Uterus Adnexa

Cervix
Not visualized (advanced GA >19wks)

Uterus
No abnormality visualized.
Comments

Two renal cysts are seen today, compared to one seen on
last ultrasound.  The new cyst is small measuring 0.5 x 0.6 x
0.5 cm.  The pre-existing cysts appears unchanged and
measures 1.1 x 0.8 x 1.0 cm.  These cysts are unlikely to be
of clinical significance unless multiple new cysts appear or
the existing cysts grow.
Impression

Single living intrauterine pregnancy at 32 weeks 6 days.
Appropriate interval fetal growth (52%).
Normal amniotic fluid volume.
Normal interval fetal anatomy.
Recommendations

Recommend follow-up ultrasound examination in 3 weeks to
reassess renal cysts.

## 2017-04-25 IMAGING — US US MFM OB FOLLOW-UP
1 series · 13 of 28 positions shown · non-contrast
Comparison: none

Road [HOSPITAL]

1  EHIKIOYA DARA           [id number]      [id number]     [id number]
Indications
35 weeks gestation of pregnancy
Fetal renal anomaly(simple cyst on M ROED)
OB History
Blood Type:            Height:  5'7"   Weight (lb):  140      BMI:
Gravidity:    1         Term:   0        Prem:   0        SAB:   0
TOP:          0       Ectopic:  0        Living: 0
Fetal Evaluation
Num Of Fetuses:     1
Fetal Heart         146
Rate(bpm):
Cardiac Activity:   Observed
Presentation:       Cephalic
Placenta:           Posterior, above cervical os
P. Cord Insertion:  Visualized
Amniotic Fluid
AFI FV:      Subjectively within normal limits
AFI Sum(cm)     %Tile       Largest Pocket(cm)
11.38           32
RUQ(cm)                     LUQ(cm)        LLQ(cm)
2.47
Biometry
BPD:      84.4  mm     G. Age:  34w 0d         11  %    CI:        82.82   %   70 - 86
FL/HC:      24.6   %   20.1 -
HC:      292.5  mm     G. Age:  32w 2d        < 3  %    HC/AC:      0.92       0.93 -
AC:      319.3  mm     G. Age:  35w 6d         59  %    FL/BPD:     85.3   %   71 - 87
FL:         72  mm     G. Age:  36w 6d         71  %    FL/AC:      22.5   %   20 - 24
Est. FW:    3058  gm    5 lb 15 oz      56  %
Gestational Age
LMP:           35w 6d       Date:   02/15/16                 EDD:   11/21/16
U/S Today:     34w 5d                                        EDD:   11/29/16
Best:          35w 6d    Det. By:   LMP  (02/15/16)          EDD:   11/21/16
Anatomy
Cranium:               Appears normal         Aortic Arch:            Previously seen
Cavum:                 Previously seen        Ductal Arch:            Previously seen
Ventricles:            Previously seen        Diaphragm:              Previously seen
Choroid Plexus:        Previously seen        Stomach:                Appears normal, left
sided
Cerebellum:            Previously seen        Abdomen:                Appears normal
Posterior Fossa:       Previously seen        Abdominal Wall:         Previously seen
Nuchal Fold:           Not applicable (>20    Cord Vessels:           Appears normal (3
wks GA)                                        vessel cord)
Face:                  Orbits and profile     Kidneys:                Abnormal, see
previously seen
comments
Lips:                  Previously seen        Bladder:                Appears normal
Thoracic:              Appears normal         Spine:                  Previously seen
Heart:                 Previously seen        Upper Extremities:      Previously seen
RVOT:                  Previously seen        Lower Extremities:      Previously seen
LVOT:                  Previously seen
Other:  Fetus appears to be a female. Technically difficult due to fetal position.
Cervix Uterus Adnexa
Cervix
Not visualized (advanced GA >60wks)
Uterus
No abnormality visualized.
Left Ovary
Size(cm)     3.38  x    1.86   x  1.93      Vol(ml):
Within normal limits.
Right Ovary
Size(cm)     2.84  x    2.13   x  2.88      Vol(ml):
Impression
INDICATION: 20 yr old G1P0 at 08w2d with fetal renal cysts for
follow up ultrasound.

[Series 1: us mfm ob follow-up · 13 of 41 slices shown]
[im 2/41]
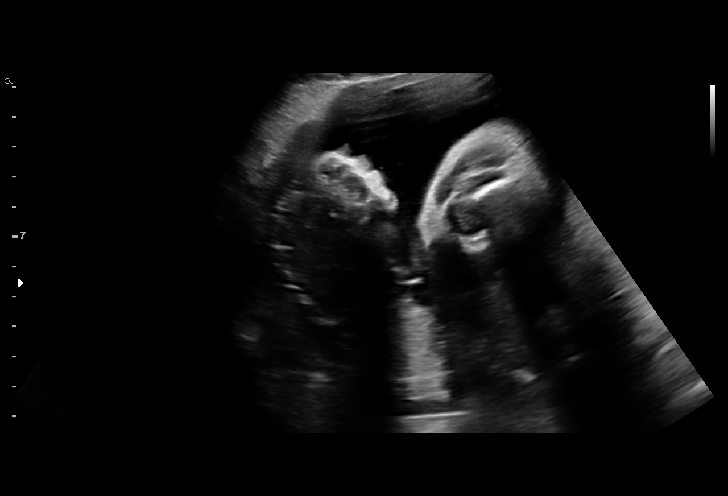
[im 5/41]
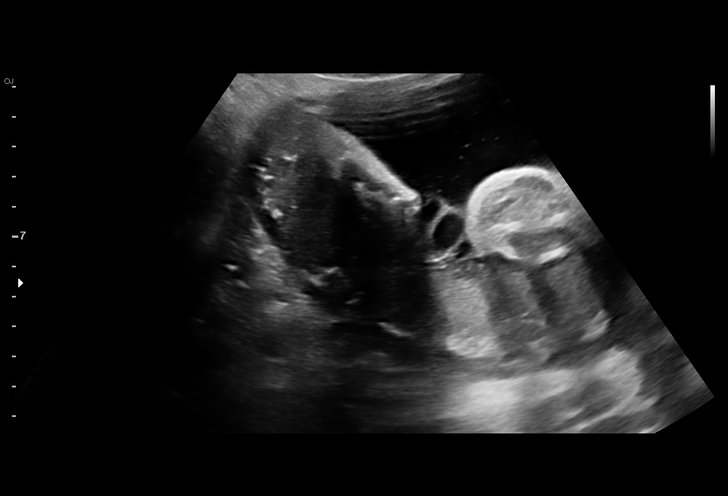
[im 8/41]
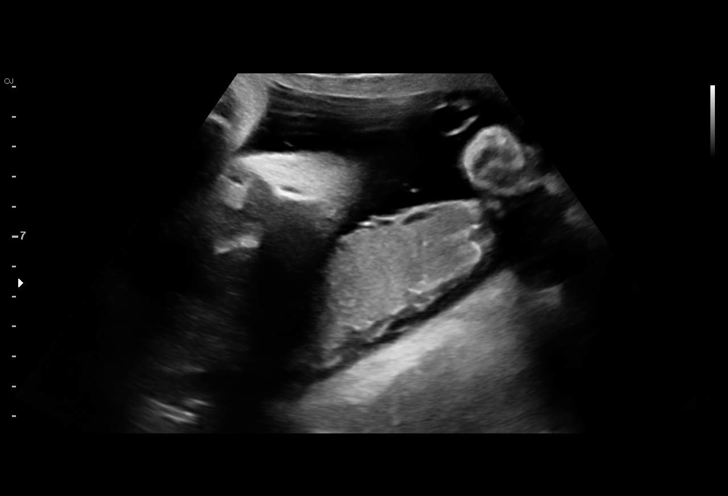
[im 11/41]
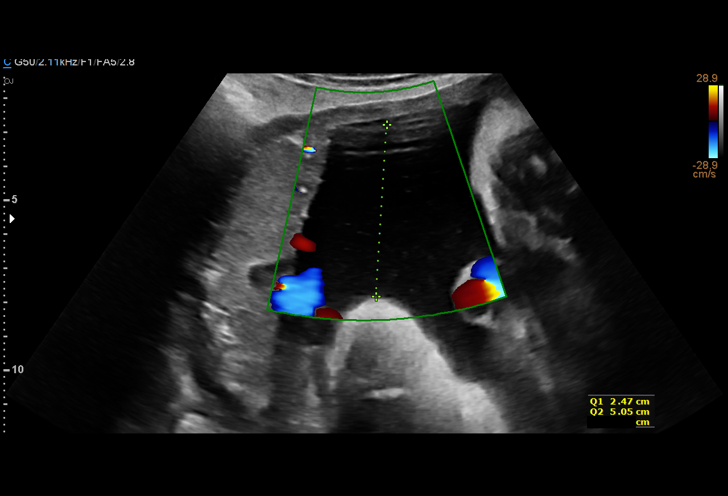
[im 14/41]
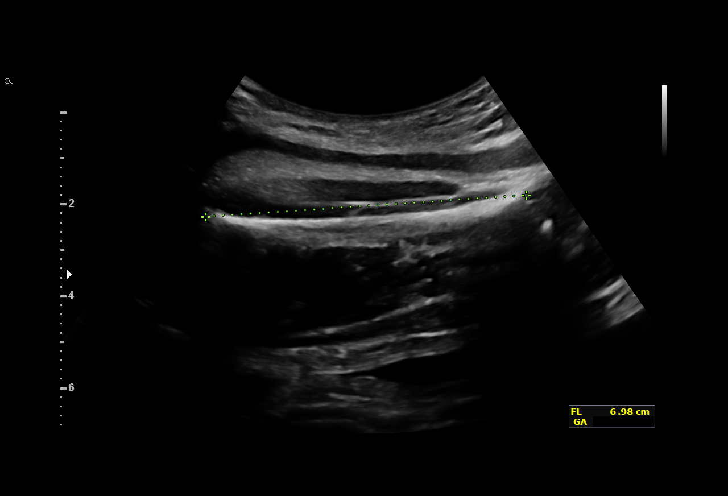
[im 17/41]
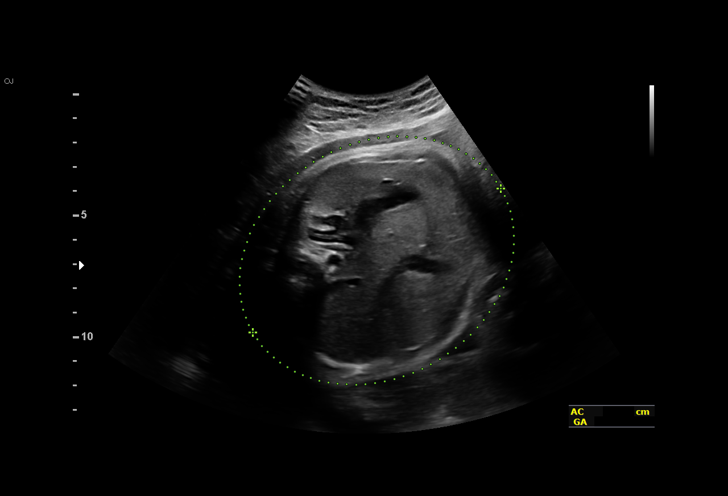
[im 21/41]
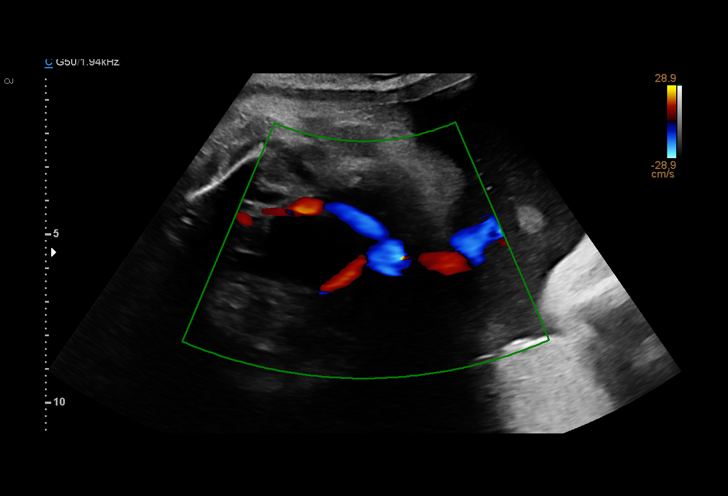
[im 24/41]
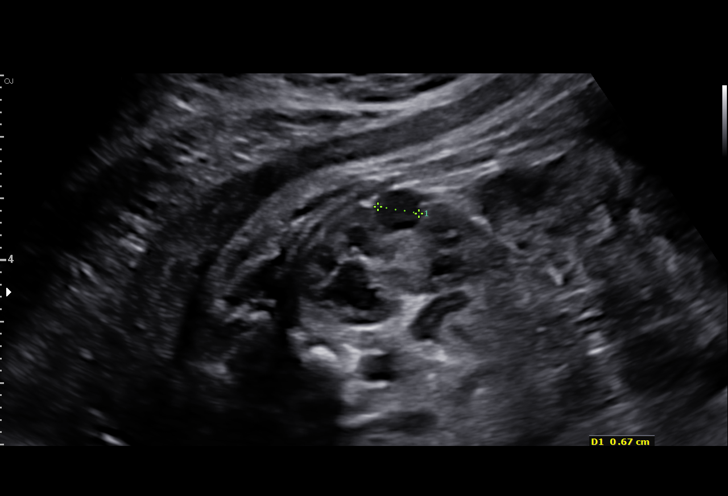
[im 27/41]
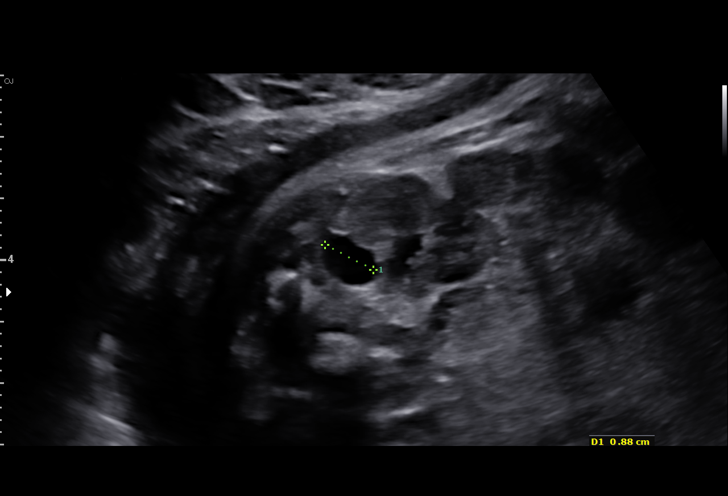
[im 30/41]
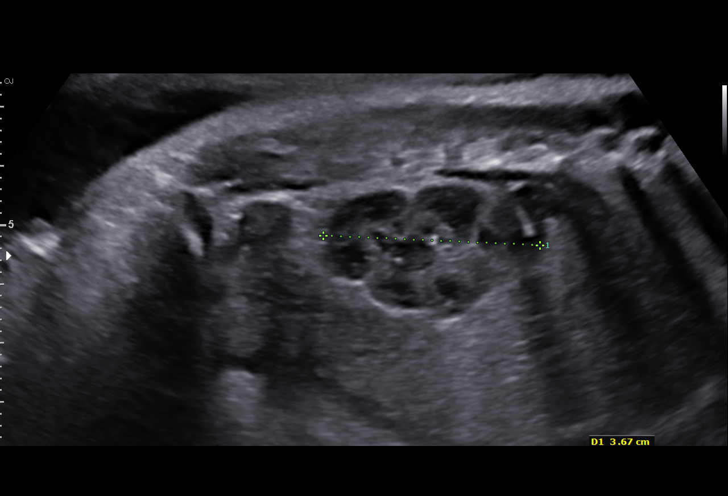
[im 33/41]
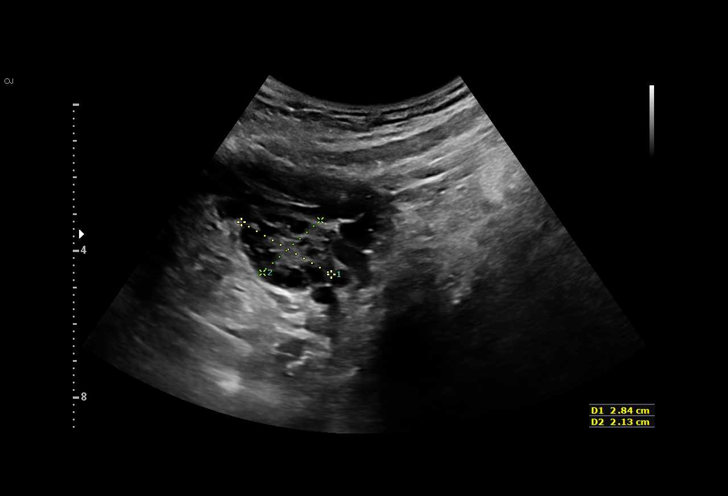
[im 36/41]
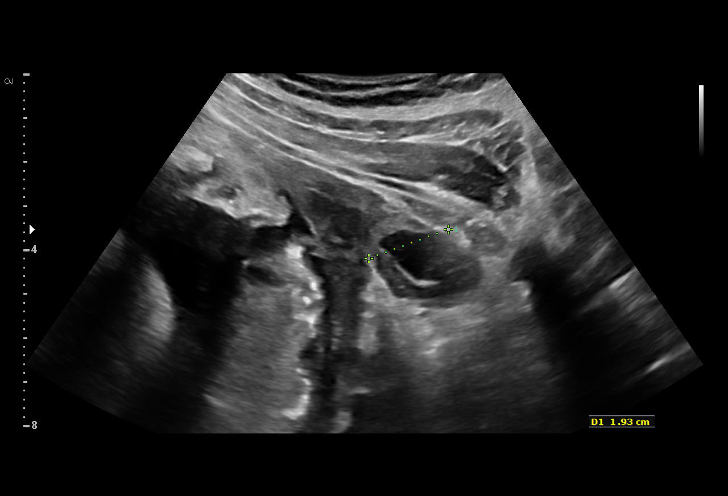
[im 39/41]
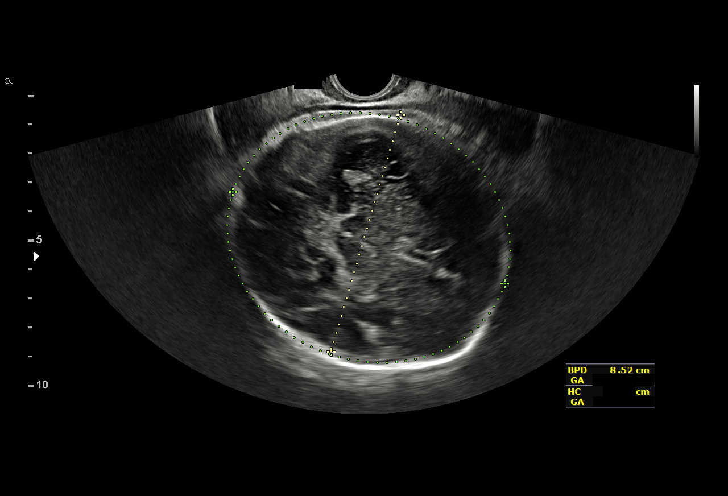

[13 of 28 positions shown; findings below may reference images not displayed]

FINDINGS: 1. Single intrauterine pregnancy.
2. Estimated fetal weight is in the 56th%.
3. Posterior placenta without evidence of previa.
4. Normal amniotic fluid index.
5. Again seen are 2 cysts on the left kidney measuring 0.8cm.
6. The remainder of the limited anatomy survey is normal.
Recommendations

1. Appropriate fetal growth.
2. Fetal renal cysts:
- previously counseled
- may be benign or multicystic dysplastic kidney
- right kidney appears normal
- normal AFI
- recommend inform Pediatrics at delivery at neonatal
evaluation
3. Low risk cell free fetal DNA and MSAFP
4. Follow up ultrasounds as clinically indicated

## 2018-01-01 ENCOUNTER — Encounter (HOSPITAL_COMMUNITY): Payer: Self-pay | Admitting: *Deleted

## 2018-01-01 ENCOUNTER — Other Ambulatory Visit: Payer: Self-pay

## 2018-01-01 ENCOUNTER — Inpatient Hospital Stay (HOSPITAL_COMMUNITY)
Admission: AD | Admit: 2018-01-01 | Discharge: 2018-01-01 | Disposition: A | Discharge status: Home / Self Care | Payer: Medicaid Other | Source: Ambulatory Visit | Attending: Obstetrics and Gynecology | Admitting: Obstetrics and Gynecology

## 2018-01-01 DIAGNOSIS — O26891 Other specified pregnancy related conditions, first trimester: Secondary | ICD-10-CM | POA: Insufficient documentation

## 2018-01-01 DIAGNOSIS — Z79899 Other long term (current) drug therapy: Secondary | ICD-10-CM | POA: Insufficient documentation

## 2018-01-01 DIAGNOSIS — O2690 Pregnancy related conditions, unspecified, unspecified trimester: Secondary | ICD-10-CM | POA: Diagnosis not present

## 2018-01-01 DIAGNOSIS — Z3A00 Weeks of gestation of pregnancy not specified: Secondary | ICD-10-CM | POA: Diagnosis not present

## 2018-01-01 LAB — URINALYSIS, ROUTINE W REFLEX MICROSCOPIC
BILIRUBIN URINE: NEGATIVE
GLUCOSE, UA: NEGATIVE mg/dL
HGB URINE DIPSTICK: NEGATIVE
Ketones, ur: 20 mg/dL — AB
Leukocytes, UA: NEGATIVE
Nitrite: NEGATIVE
PH: 5 (ref 5.0–8.0)
Protein, ur: NEGATIVE mg/dL
Specific Gravity, Urine: 1.024 (ref 1.005–1.030)

## 2018-01-01 LAB — POCT PREGNANCY, URINE: Preg Test, Ur: NEGATIVE

## 2018-01-01 LAB — HCG, QUANTITATIVE, PREGNANCY: HCG, BETA CHAIN, QUANT, S: 13 m[IU]/mL — AB (ref <5)

## 2018-01-01 NOTE — MAU Note (Addendum)
Pt presents for pregnancy test.  Reports had induced abortion approximately 3 weeks ago.  Reports has taken 2 HPT, both positive.  Denies pain Reports sexually active since AB, unsure if new pregancy.

## 2018-01-01 NOTE — MAU Provider Note (Signed)
History     CSN: [id number]  Arrival date and time: 01/01/18 1507   First Provider Initiated Contact with Patient 01/01/18 1716      Chief Complaint  Patient presents with  . Pregnancy Test   HPI  Ms.  Kathleen Zhang is a 22 y.o. year old [id number] possibly pregnant female who presents to MAU reporting she had an EAB 3 wks ago (unsure of actual date). She reports that she "took the medicine and bleed for about 2 wks afterwards, but then stopped". She kept her follow-up appt with the clinic that Rx'd the EAB medications. She states "they said everything is ok". She reports having SI during the past 3 wks as well. She noticed her "eating habits were decreased" lately and was concerned she has a new pregnancy. She had a negative HPT yesterday (12/31/17) and a positive HPT on (01/01/18). She reports the HPT today was after 15 mins of waiting. She is here today with no complaints and requesting pregnancy confirmation.  Past Medical History:  Diagnosis Date  . Medical history non-contributory     Past Surgical History:  Procedure Laterality Date  . NO PAST SURGERIES      History reviewed. No pertinent family history.  Social History   Tobacco Use  . Smoking status: Never Smoker  . Smokeless tobacco: Never Used  Substance Use Topics  . Alcohol use: No  . Drug use: No    Allergies: No Known Allergies  Medications Prior to Admission  Medication Sig Dispense Refill Last Dose  . amLODipine (NORVASC) 5 MG tablet Take 1 tablet (5 mg total) by mouth daily. (Patient not taking: Reported on 11/27/2016) 30 tablet 12 Not Taking  . ibuprofen (ADVIL,MOTRIN) 600 MG tablet Take 1 tablet (600 mg total) by mouth every 6 (six) hours. 120 tablet 2 Taking  . Norgestimate-Ethinyl Estradiol Triphasic 0.18/0.215/0.25 MG-35 MCG tablet Take 1 tablet by mouth daily. 1 Package 11   . Prenat-FeCbn-FeAspGl-FA-Omega (OB COMPLETE PETITE) 35-5-1-200 MG CAPS Take 1 tablet by mouth daily. 30 capsule 12 Taking     Review of Systems  Constitutional: Positive for appetite change.  HENT: Negative.   Eyes: Negative.   Respiratory: Negative.   Cardiovascular: Negative.   Gastrointestinal: Negative.   Endocrine: Negative.   Genitourinary: Negative.   Musculoskeletal: Negative.   Skin: Negative.   Allergic/Immunologic: Negative.   Neurological: Negative.   Hematological: Negative.   Psychiatric/Behavioral: Negative.    Physical Exam   Blood pressure 138/60, pulse 77, temperature 98.2 F (36.8 C), temperature source Oral, resp. rate 20, SpO2 100 %, not currently breastfeeding.  Physical Exam  Nursing note and vitals reviewed. Constitutional: She is oriented to person, place, and time. She appears well-developed and well-nourished.  HENT:  Head: Normocephalic and atraumatic.  Eyes: Pupils are equal, round, and reactive to light.  Neck: Normal range of motion.  Cardiovascular: Normal rate.  Respiratory: Effort normal.  GI: Soft. She exhibits no distension and no mass. There is no tenderness. There is no rebound and no guarding.  Genitourinary:  Genitourinary Comments: Pelvic deferred  Musculoskeletal: Normal range of motion.  Neurological: She is alert and oriented to person, place, and time.  Skin: Skin is warm and dry.  Psychiatric: She has a normal mood and affect. Her behavior is normal. Judgment and thought content normal.    MAU Course  Procedures  MDM CCUA UPT HCG  Results for orders placed or performed during the hospital encounter of 01/01/18 (from the past 24 hour(s))  Urinalysis, Routine w reflex microscopic     Status: Abnormal   Collection Time: 01/01/18  4:14 PM  Result Value Ref Range   Color, Urine YELLOW YELLOW   APPearance CLEAR CLEAR   Specific Gravity, Urine 1.024 1.005 - 1.030   pH 5.0 5.0 - 8.0   Glucose, UA NEGATIVE NEGATIVE mg/dL   Hgb urine dipstick NEGATIVE NEGATIVE   Bilirubin Urine NEGATIVE NEGATIVE   Ketones, ur 20 (A) NEGATIVE mg/dL    Protein, ur NEGATIVE NEGATIVE mg/dL   Nitrite NEGATIVE NEGATIVE   Leukocytes, UA NEGATIVE NEGATIVE  Pregnancy, urine POC     Status: None   Collection Time: 01/01/18  4:19 PM  Result Value Ref Range   Preg Test, Ur NEGATIVE NEGATIVE  hCG, quantitative, pregnancy     Status: Abnormal   Collection Time: 01/01/18  4:22 PM  Result Value Ref Range   hCG, Beta Chain, Quant, S 13 (H) <5 mIU/mL    Assessment and Plan  Pregnancy-related symptom, antepartum - Plan: Discharge patient - Return to MAU on Sunday 01/03/2018 for repeat HCG after 4 pm - Information provided on HCG  - Discussed with pt the low level of HCG in blood is not enough to determine if this is a new pregnancy or not - Discharge home - Patient verbalized an understanding of the plan of care and agrees.   Raelyn Mora, MSN, CNM 01/01/2018, 5:16 PM

## 2018-01-03 ENCOUNTER — Inpatient Hospital Stay (HOSPITAL_COMMUNITY)
Admission: AD | Admit: 2018-01-03 | Discharge: 2018-01-03 | Disposition: A | Discharge status: Home / Self Care | Payer: Medicaid Other | Source: Ambulatory Visit | Attending: Obstetrics and Gynecology | Admitting: Obstetrics and Gynecology

## 2018-01-03 ENCOUNTER — Other Ambulatory Visit: Payer: Self-pay

## 2018-01-03 DIAGNOSIS — Z9889 Other specified postprocedural states: Secondary | ICD-10-CM | POA: Diagnosis not present

## 2018-01-03 DIAGNOSIS — Z3202 Encounter for pregnancy test, result negative: Secondary | ICD-10-CM | POA: Insufficient documentation

## 2018-01-03 LAB — HCG, QUANTITATIVE, PREGNANCY: HCG, BETA CHAIN, QUANT, S: 9 m[IU]/mL — AB (ref <5)

## 2018-01-03 NOTE — MAU Note (Signed)
No complaints offered. No pain, no bleeding.

## 2018-01-03 NOTE — MAU Provider Note (Addendum)
History     CSN: [id number]  Arrival date and time: 01/03/18 1108  Seen by provider at 1302    Chief Complaint  Patient presents with  . Follow-up   HPI Kathleen Zhang 22 y.o.  Is here today for a follow up visit from 01-01-18.  Notes and labs reviewed from 01-01-18.  Is having no pain or vaginal bleeding today.  Had follow up visit from her elective medical AB on Tuesday, June 4th 9 (checkeked her phone for the date).  They said everything was fine and gave her birth control pills which she has not started taking.  She was trying to confirm that she is not pregnant so she could start taking the pills.  She has an MD appointment tomorrow in [place] with "her" doctor for continued follow up.  OB History    Gravida  2   Para  1   Term  1   Preterm      AB  1   Living  1     SAB      TAB  1   Ectopic      Multiple  0   Live Births  1           Past Medical History:  Diagnosis Date  . Medical history non-contributory     Past Surgical History:  Procedure Laterality Date  . NO PAST SURGERIES      No family history on file.  Social History   Tobacco Use  . Smoking status: Never Smoker  . Smokeless tobacco: Never Used  Substance Use Topics  . Alcohol use: No  . Drug use: No    Allergies: No Known Allergies  Medications Prior to Admission  Medication Sig Dispense Refill Last Dose  . Norgestimate-Ethinyl Estradiol Triphasic 0.18/0.215/0.25 MG-35 MCG tablet Take 1 tablet by mouth daily. 1 Package 11   . Prenat-FeCbn-FeAspGl-FA-Omega (OB COMPLETE PETITE) 35-5-1-200 MG CAPS Take 1 tablet by mouth daily. 30 capsule 12 Taking    Review of Systems  Constitutional: Negative for fever.  Gastrointestinal: Negative for abdominal pain, nausea and vomiting.  Genitourinary: Negative for vaginal bleeding and vaginal discharge.   Physical Exam   Blood pressure (!) 121/58, pulse 78, temperature 98.5 F (36.9 C), temperature source Oral, resp. rate 16, not  currently breastfeeding.  Physical Exam  Nursing note and vitals reviewed. Constitutional: She is oriented to person, place, and time. She appears well-developed and well-nourished.  HENT:  Head: Normocephalic.  Eyes: EOM are normal.  Neck: Neck supple.  Respiratory: Effort normal.  Musculoskeletal: Normal range of motion.  Neurological: She is alert and oriented to person, place, and time.  Skin: Skin is warm and dry.  Psychiatric: She has a normal mood and affect.    MAU Course  Procedures hCG, Beta Chain, Quant, S <5 mIU/mL 9 High   13 High  CM          01-03-18 01-01-18   MDM Quants are dropping and are very low.  Given that it may take several weeks after a AB for quant levels to drop completely, this is likely not a new pregnancy but the resolution of her EAB.  (Most likely we have ruled out a pregnancy of unknown anatomic location.)  Discussed with client and encouraged her to start her birth control pills - BP is normal - and she does not desire to be pregnant at this time.  Strongly encouraged her to keep her appointment with her doctor in [place]  tomorrow.  Assessment and Plan  Resolving EAB - falling quants Blood type B+  Plan See MDM  Currie Pariserri L Yvan Dority 01/03/2018, 1:05 PM

## 2018-05-19 ENCOUNTER — Ambulatory Visit (INDEPENDENT_AMBULATORY_CARE_PROVIDER_SITE_OTHER): Payer: Medicaid Other

## 2018-05-19 VITALS — BP 130/76 | HR 66 | Wt 144.6 lb

## 2018-05-19 DIAGNOSIS — Z3200 Encounter for pregnancy test, result unknown: Secondary | ICD-10-CM

## 2018-05-19 DIAGNOSIS — Z3201 Encounter for pregnancy test, result positive: Secondary | ICD-10-CM

## 2018-05-19 LAB — POCT URINE PREGNANCY: PREG TEST UR: POSITIVE — AB

## 2018-05-19 NOTE — Progress Notes (Signed)
Pt is here for urine pregnancy test. UPT positive. Pt thinks here LMP was around 04/09/18, unsure of exact date. Pt given prenatal vitamin samples and safe medication list, advised to schedule appt for last week of November. Pt verbalized understanding.

## 2018-06-23 ENCOUNTER — Encounter: Payer: Medicaid Other | Admitting: Obstetrics and Gynecology

## 2018-06-24 ENCOUNTER — Encounter: Payer: Medicaid Other | Admitting: Obstetrics and Gynecology

## 2018-09-18 ENCOUNTER — Other Ambulatory Visit: Payer: Self-pay

## 2018-09-18 ENCOUNTER — Emergency Department (HOSPITAL_BASED_OUTPATIENT_CLINIC_OR_DEPARTMENT_OTHER): Payer: BLUE CROSS/BLUE SHIELD

## 2018-09-18 ENCOUNTER — Emergency Department (HOSPITAL_BASED_OUTPATIENT_CLINIC_OR_DEPARTMENT_OTHER)
Admission: EM | Admit: 2018-09-18 | Discharge: 2018-09-18 | Disposition: A | Discharge status: Home / Self Care | Payer: BLUE CROSS/BLUE SHIELD | Attending: Emergency Medicine | Admitting: Emergency Medicine

## 2018-09-18 ENCOUNTER — Encounter (HOSPITAL_BASED_OUTPATIENT_CLINIC_OR_DEPARTMENT_OTHER): Payer: Self-pay

## 2018-09-18 DIAGNOSIS — N739 Female pelvic inflammatory disease, unspecified: Secondary | ICD-10-CM | POA: Diagnosis not present

## 2018-09-18 DIAGNOSIS — R102 Pelvic and perineal pain: Secondary | ICD-10-CM | POA: Insufficient documentation

## 2018-09-18 DIAGNOSIS — R1030 Lower abdominal pain, unspecified: Secondary | ICD-10-CM | POA: Diagnosis present

## 2018-09-18 DIAGNOSIS — N730 Acute parametritis and pelvic cellulitis: Secondary | ICD-10-CM

## 2018-09-18 LAB — CBC WITH DIFFERENTIAL/PLATELET
ABS IMMATURE GRANULOCYTES: 0.07 10*3/uL (ref 0.00–0.07)
Basophils Absolute: 0 10*3/uL (ref 0.0–0.1)
Basophils Relative: 0 %
EOS ABS: 0.2 10*3/uL (ref 0.0–0.5)
Eosinophils Relative: 1 %
HCT: 35.8 % — ABNORMAL LOW (ref 36.0–46.0)
HEMOGLOBIN: 11.6 g/dL — AB (ref 12.0–15.0)
IMMATURE GRANULOCYTES: 1 %
Lymphocytes Relative: 5 %
Lymphs Abs: 0.7 10*3/uL (ref 0.7–4.0)
MCH: 28.7 pg (ref 26.0–34.0)
MCHC: 32.4 g/dL (ref 30.0–36.0)
MCV: 88.6 fL (ref 80.0–100.0)
MONO ABS: 0.7 10*3/uL (ref 0.1–1.0)
MONOS PCT: 5 %
NEUTROS PCT: 88 %
Neutro Abs: 13.4 10*3/uL — ABNORMAL HIGH (ref 1.7–7.7)
Platelets: 229 10*3/uL (ref 150–400)
RBC: 4.04 MIL/uL (ref 3.87–5.11)
RDW: 12.1 % (ref 11.5–15.5)
SMEAR REVIEW: NORMAL
WBC: 15.1 10*3/uL — ABNORMAL HIGH (ref 4.0–10.5)
nRBC: 0 % (ref 0.0–0.2)

## 2018-09-18 LAB — URINALYSIS, ROUTINE W REFLEX MICROSCOPIC
Bilirubin Urine: NEGATIVE
GLUCOSE, UA: NEGATIVE mg/dL
KETONES UR: NEGATIVE mg/dL
Nitrite: NEGATIVE
PH: 7 (ref 5.0–8.0)
Protein, ur: NEGATIVE mg/dL
SPECIFIC GRAVITY, URINE: 1.015 (ref 1.005–1.030)

## 2018-09-18 LAB — URINALYSIS, MICROSCOPIC (REFLEX)

## 2018-09-18 LAB — COMPREHENSIVE METABOLIC PANEL
ALK PHOS: 58 U/L (ref 38–126)
ALT: 13 U/L (ref 0–44)
AST: 18 U/L (ref 15–41)
Albumin: 4.6 g/dL (ref 3.5–5.0)
Anion gap: 8 (ref 5–15)
BILIRUBIN TOTAL: 0.8 mg/dL (ref 0.3–1.2)
BUN: 11 mg/dL (ref 6–20)
CALCIUM: 9.1 mg/dL (ref 8.9–10.3)
CO2: 23 mmol/L (ref 22–32)
CREATININE: 0.65 mg/dL (ref 0.44–1.00)
Chloride: 102 mmol/L (ref 98–111)
GFR calc Af Amer: >60 mL/min (ref >60)
Glucose, Bld: 89 mg/dL (ref 70–99)
POTASSIUM: 3.4 mmol/L — AB (ref 3.5–5.1)
Sodium: 133 mmol/L — ABNORMAL LOW (ref 135–145)
TOTAL PROTEIN: 7.9 g/dL (ref 6.5–8.1)

## 2018-09-18 LAB — WET PREP, GENITAL
Clue Cells Wet Prep HPF POC: NONE SEEN
Sperm: NONE SEEN
TRICH WET PREP: NONE SEEN
YEAST WET PREP: NONE SEEN

## 2018-09-18 LAB — PREGNANCY, URINE: Preg Test, Ur: POSITIVE — AB

## 2018-09-18 LAB — LACTIC ACID, PLASMA: LACTIC ACID, VENOUS: 1.1 mmol/L (ref 0.5–1.9)

## 2018-09-18 MED ORDER — METRONIDAZOLE 500 MG PO TABS
500.0000 mg | ORAL_TABLET | Freq: Once | ORAL | Status: AC
  Administered 2018-09-18: 500 mg via ORAL
  Filled 2018-09-18: qty 1

## 2018-09-18 MED ORDER — ACETAMINOPHEN 325 MG PO TABS
650.0000 mg | ORAL_TABLET | Freq: Once | ORAL | Status: AC
  Administered 2018-09-18: 650 mg via ORAL
  Filled 2018-09-18: qty 2

## 2018-09-18 MED ORDER — SODIUM CHLORIDE 0.9 % IV BOLUS
1000.0000 mL | Freq: Once | INTRAVENOUS | Status: AC
  Administered 2018-09-18: 1000 mL via INTRAVENOUS

## 2018-09-18 MED ORDER — CEFTRIAXONE SODIUM 250 MG IJ SOLR
250.0000 mg | Freq: Once | INTRAMUSCULAR | Status: AC
  Administered 2018-09-18: 250 mg via INTRAMUSCULAR
  Filled 2018-09-18: qty 250

## 2018-09-18 MED ORDER — DOXYCYCLINE HYCLATE 100 MG PO CAPS: 100.0000 mg | ORAL_CAPSULE | Freq: Two times a day (BID) | ORAL | 0 refills | Status: AC

## 2018-09-18 MED ORDER — ONDANSETRON HCL 4 MG/2ML IJ SOLN
4.0000 mg | Freq: Once | INTRAMUSCULAR | Status: AC
  Administered 2018-09-18: 4 mg via INTRAVENOUS
  Filled 2018-09-18: qty 2

## 2018-09-18 MED ORDER — ONDANSETRON 4 MG PO TBDP: 4.0000 mg | ORAL_TABLET | Freq: Three times a day (TID) | ORAL | 0 refills | Status: DC | PRN

## 2018-09-18 MED ORDER — DOXYCYCLINE HYCLATE 100 MG PO TABS
100.0000 mg | ORAL_TABLET | Freq: Once | ORAL | Status: AC
  Administered 2018-09-18: 100 mg via ORAL
  Filled 2018-09-18: qty 1

## 2018-09-18 MED ORDER — METRONIDAZOLE 500 MG PO TABS: 500.0000 mg | ORAL_TABLET | Freq: Two times a day (BID) | ORAL | 0 refills | Status: AC

## 2018-09-18 NOTE — ED Provider Notes (Signed)
[facility] EMERGENCY DEPARTMENT Provider Note   CSN: [id number] Arrival date & time: 09/18/18  1137    History   Chief Complaint Chief Complaint  Patient presents with  . Generalized Body Aches    HPI Kathleen Zhang is a 23 y.o. female.     HPI  23 year old female presents with subjective fever, lower abdominal pain and muscle aches.  About 2 weeks ago she had a medical abortion that she states she was given a pill for.  She had cramping and some bleeding afterwards.  She started to have intercourse about 6 days ago which she thinks might of been too early based on MD recommendations.  She also has been placing a gel in her vagina.  Since then has had a little bit of light bleeding and has had progressively worsening abdominal pain over the last several days.  It is lower in midline.  No vomiting.  No urinary symptoms.  Has a little bit of a headache, some mild congestion but no significant cough.  She did have some chest pain in the shower yesterday but states that has resolved.  She has taken Aleve twice since last night for the lower abdominal pain.  Past Medical History:  Diagnosis Date  . Medical history non-contributory     Patient Active Problem List   Diagnosis Date Noted  . Pregnancy-related symptom 01/01/2018    Past Surgical History:  Procedure Laterality Date  . NO PAST SURGERIES       OB History    Gravida  3   Para  1   Term  1   Preterm      AB  1   Living  1     SAB      TAB  1   Ectopic      Multiple  0   Live Births  1            Home Medications    Prior to Admission medications   Medication Sig Start Date End Date Taking? Authorizing Provider  Norgestimate-Ethinyl Estradiol Triphasic 0.18/0.215/0.25 MG-35 MCG tablet Take 1 tablet by mouth daily. Patient not taking: Reported on 05/19/2018 12/22/16   Adam Phenix, MD  Prenat-FeCbn-FeAspGl-FA-Omega (OB COMPLETE PETITE) 35-5-1-200 MG CAPS Take 1 tablet by mouth  daily. Patient not taking: Reported on 05/19/2018 06/04/16   Roe Coombs, CNM    Family History No family history on file.  Social History Social History   Tobacco Use  . Smoking status: Never Smoker  . Smokeless tobacco: Never Used  Substance Use Topics  . Alcohol use: No  . Drug use: No     Allergies   Patient has no known allergies.   Review of Systems Review of Systems  Constitutional: Positive for fever.  HENT: Positive for congestion.   Respiratory: Negative for cough and shortness of breath.   Cardiovascular: Positive for chest pain.  Gastrointestinal: Positive for abdominal pain. Negative for vomiting.  Genitourinary: Positive for vaginal bleeding. Negative for dysuria and vaginal discharge.  Musculoskeletal: Positive for myalgias.  Neurological: Positive for headaches.  All other systems reviewed and are negative.    Physical Exam Updated Vital Signs BP 133/85 (BP Location: Left Arm)   Pulse (!) 119   Temp 99.7 F (37.6 C) (Oral)   Resp 14   Ht 5\' 7"  (1.702 m)   Wt 65.8 kg   LMP 07/07/2018 (Approximate)   SpO2 100%   Breastfeeding Unknown   BMI 22.71 kg/m  Physical Exam Vitals signs and nursing note reviewed.  Constitutional:      General: She is not in acute distress.    Appearance: She is well-developed. She is not ill-appearing or diaphoretic.  HENT:     Head: Normocephalic and atraumatic.     Right Ear: External ear normal.     Left Ear: External ear normal.     Nose: Nose normal.  Eyes:     General:        Right eye: No discharge.        Left eye: No discharge.  Cardiovascular:     Rate and Rhythm: Regular rhythm. Tachycardia present.     Heart sounds: Normal heart sounds.  Pulmonary:     Effort: Pulmonary effort is normal.     Breath sounds: Normal breath sounds. No wheezing, rhonchi or rales.  Abdominal:     Palpations: Abdomen is soft.     Tenderness: There is abdominal tenderness (mild) in the suprapubic area. There  is no right CVA tenderness or left CVA tenderness.  Genitourinary:    Cervix: Discharge present. No cervical motion tenderness.     Comments: Large amount of dark discharge in vagina, appears to be from cervix. However no significant tenderness on exam Skin:    General: Skin is warm and dry.  Neurological:     Mental Status: She is alert.  Psychiatric:        Mood and Affect: Mood is not anxious.      ED Treatments / Results  Labs (all labs ordered are listed, but only abnormal results are displayed) Labs Reviewed  WET PREP, GENITAL - Abnormal; Notable for the following components:      Result Value   WBC, Wet Prep HPF POC MANY (*)    All other components within normal limits  PREGNANCY, URINE - Abnormal; Notable for the following components:   Preg Test, Ur POSITIVE (*)    All other components within normal limits  URINALYSIS, ROUTINE W REFLEX MICROSCOPIC - Abnormal; Notable for the following components:   Hgb urine dipstick MODERATE (*)    Leukocytes,Ua SMALL (*)    All other components within normal limits  COMPREHENSIVE METABOLIC PANEL - Abnormal; Notable for the following components:   Sodium 133 (*)    Potassium 3.4 (*)    All other components within normal limits  CBC WITH DIFFERENTIAL/PLATELET - Abnormal; Notable for the following components:   WBC 15.1 (*)    Hemoglobin 11.6 (*)    HCT 35.8 (*)    Neutro Abs 13.4 (*)    All other components within normal limits  URINALYSIS, MICROSCOPIC (REFLEX) - Abnormal; Notable for the following components:   Bacteria, UA FEW (*)    All other components within normal limits  URINE CULTURE  CULTURE, BLOOD (ROUTINE X 2)  CULTURE, BLOOD (ROUTINE X 2)  LACTIC ACID, PLASMA  LACTIC ACID, PLASMA  RPR  HIV ANTIBODY (ROUTINE TESTING W REFLEX)  GC/CHLAMYDIA PROBE AMP ([place]) NOT AT [facility]    EKG EKG Interpretation  Date/Time:  Saturday September 18 2018 13:13:32 EST Ventricular Rate:  101 PR Interval:    QRS  Duration: 73 QT Interval:  327 QTC Calculation: 424 R Axis:   91 Text Interpretation:  Sinus tachycardia Borderline right axis deviation No old tracing to compare Confirmed by Pricilla Loveless [phone]) on 09/18/2018 1:58:08 PM   Radiology Dg Chest 2 View  Result Date: 09/18/2018 CLINICAL DATA:  Chest pain beginning last night.  Fever.  EXAM: CHEST - 2 VIEW COMPARISON:  None. FINDINGS: The heart size and mediastinal contours are within normal limits. Both lungs are clear. The visualized skeletal structures are unremarkable. IMPRESSION: Negative.  No active cardiopulmonary disease. Electronically Signed   By: Myles Rosenthal M.D.   On: 09/18/2018 13:31   US Pelvis Transvanginal Non-ob (tv Only)  Result Date: 09/18/2018 CLINICAL DATA:  Abortion 2 weeks ago.  Vaginal spotting. EXAM: TRANSABDOMINAL ULTRASOUND OF PELVIS TECHNIQUE: Transabdominal ultrasound examination of the pelvis was performed including evaluation of the uterus, ovaries, adnexal regions, and pelvic cul-de-sac. COMPARISON:  None. FINDINGS: Uterus Measurements: 8.6 x 4.6 x 6 cm = volume: 125 mL. Small amount of fluid in the cervical canal. No fibroids or other mass visualized. Endometrium Thickness: Thickened endometrium measuring 16 mm. No focal abnormality visualized. Right ovary Measurements: 4.8 x 2.9 x 2.1 cm = volume: 15 mL. Normal appearance/no adnexal mass. Multiple follicles. Left ovary Measurements: 4 x 2.5 x 1.9 cm = volume: 10 mL. 1.5 x 1.3 x 1.1 cm complex cystic left ovarian mass likely reflecting an involuted corpus luteum cyst. Other findings:  Small amount of pelvic free fluid. IMPRESSION: 1. No intrauterine pregnancy is identified. Heterogeneous mild endometrial thickening likely related to recent pregnancy an abortion. Small amount of fluid in the cervical canal. Electronically Signed   By: Elige Ko   On: 09/18/2018 14:06    Procedures Procedures (including critical care time)  Medications Ordered in ED Medications   metroNIDAZOLE (FLAGYL) tablet 500 mg (has no administration in time range)  acetaminophen (TYLENOL) tablet 650 mg (650 mg Oral Given 09/18/18 1306)  cefTRIAXone (ROCEPHIN) injection 250 mg (250 mg Intramuscular Given 09/18/18 1302)  doxycycline (VIBRA-TABS) tablet 100 mg (100 mg Oral Given 09/18/18 1418)  sodium chloride 0.9 % bolus 1,000 mL (1,000 mLs Intravenous New Bag/Given 09/18/18 1416)     Initial Impression / Assessment and Plan / ED Course  I have reviewed the triage vital signs and the nursing notes.  Pertinent labs & imaging results that were available during my care of the patient were reviewed by me and considered in my medical decision making (see chart for details).        Patient overall does not appear ill though she is tachycardic and has a low-grade temperature of 99.7.  Exam shows large amount of discharge and my concern for her subjective fever is coming from her uterus.  Probably this is endometritis or PID given no obvious products of conception.  I discussed patient's case and reviewed ultrasounds with Dr. Vergie Living of OB/GYN.  He recommends Rocephin and doxy/Flagyl and close outpatient follow-up with whoever did her abortion in 2 days.  Given her benign exam otherwise with minimal tenderness and feeling much better with the Tylenol and fluids, I think is reasonable to treat her outpatient but I discussed strict return precautions.  She did vomit once in the emergency department, though this was after not having eaten all day and then being given multiple antibiotics.  She still would like to go home which I think is reasonable.  Her heart rate is in the 90s on my last recheck.  She was advised of strict return precautions but appears stable for discharge home to follow-up closely with GYN.  Final Clinical Impressions(s) / ED Diagnoses   Final diagnoses:  PID (acute pelvic inflammatory disease)    ED Discharge Orders    None       Pricilla Loveless, MD 09/18/18  1511

## 2018-09-18 NOTE — ED Triage Notes (Signed)
Pt reports generalized body aches x 4 days. Also sts she had abortion 2 weeks ago. Lower abdominal cramping as well.

## 2018-09-18 NOTE — ED Notes (Signed)
Chest pain to midsternal area last night but no chest pain today.

## 2018-09-18 NOTE — ED Notes (Signed)
Vomited clear mucosy vomitus, moderate in amount and stated that she felt better after vomiting.  MD made aware with new order noted.

## 2018-09-18 NOTE — ED Notes (Signed)
ED Provider at bedside. 

## 2018-09-18 NOTE — Discharge Instructions (Signed)
It is very important to follow-up with your OB/GYN on Monday, 09/20/2018.  If you cannot see them, you can follow-up with the women's outpatient clinic.  If at any point you develop fever, vomiting, worsening abdominal pain, or any other new/concerning symptoms then return to the ER for evaluation.  Take the antibiotics as prescribed until completed.  Do not have intercourse until these are completed and cleared by your doctor.  Do not drink alcohol while on these medicines.

## 2018-09-19 LAB — URINE CULTURE: Culture: 80000 — AB

## 2018-09-19 LAB — RPR: RPR: NONREACTIVE

## 2018-09-19 LAB — HIV ANTIBODY (ROUTINE TESTING W REFLEX): HIV Screen 4th Generation wRfx: NONREACTIVE

## 2018-09-20 ENCOUNTER — Telehealth: Payer: Self-pay

## 2018-09-20 LAB — GC/CHLAMYDIA PROBE AMP (~~LOC~~) NOT AT ARMC
Chlamydia: NEGATIVE
Neisseria Gonorrhea: NEGATIVE

## 2018-09-20 NOTE — Telephone Encounter (Signed)
Np further treatment for [facility] ED 09/18/2018 per [facility]

## 2018-09-23 LAB — CULTURE, BLOOD (ROUTINE X 2)
CULTURE: NO GROWTH
Culture: NO GROWTH
SPECIAL REQUESTS: ADEQUATE
Special Requests: ADEQUATE

## 2018-10-04 ENCOUNTER — Ambulatory Visit: Payer: Self-pay

## 2018-10-04 NOTE — Telephone Encounter (Signed)
Attempted to contact patient Cell number is restricted. DPR state to leave message on cell.

## 2018-10-04 NOTE — Telephone Encounter (Signed)
Attempted to call pt but no answer at this time. Call goes through as restricted. Unable to leave message.

## 2018-11-01 ENCOUNTER — Encounter (HOSPITAL_COMMUNITY): Payer: Self-pay | Admitting: Emergency Medicine

## 2018-11-01 ENCOUNTER — Emergency Department (HOSPITAL_COMMUNITY)
Admission: EM | Admit: 2018-11-01 | Discharge: 2018-11-01 | Disposition: A | Discharge status: Home / Self Care | Payer: BLUE CROSS/BLUE SHIELD | Attending: Emergency Medicine | Admitting: Emergency Medicine

## 2018-11-01 ENCOUNTER — Other Ambulatory Visit: Payer: Self-pay

## 2018-11-01 DIAGNOSIS — J020 Streptococcal pharyngitis: Secondary | ICD-10-CM | POA: Insufficient documentation

## 2018-11-01 DIAGNOSIS — R509 Fever, unspecified: Secondary | ICD-10-CM | POA: Insufficient documentation

## 2018-11-01 DIAGNOSIS — J029 Acute pharyngitis, unspecified: Secondary | ICD-10-CM | POA: Diagnosis present

## 2018-11-01 LAB — GROUP A STREP BY PCR: Group A Strep by PCR: DETECTED — AB

## 2018-11-01 MED ORDER — DEXAMETHASONE 4 MG PO TABS
10.0000 mg | ORAL_TABLET | Freq: Once | ORAL | Status: AC
  Administered 2018-11-01: 11:00:00 10 mg via ORAL
  Filled 2018-11-01: qty 3

## 2018-11-01 MED ORDER — ACETAMINOPHEN 325 MG PO TABS
650.0000 mg | ORAL_TABLET | Freq: Once | ORAL | Status: AC
  Administered 2018-11-01: 650 mg via ORAL
  Filled 2018-11-01: qty 2

## 2018-11-01 MED ORDER — PENICILLIN G BENZATHINE & PROC 1200000 UNIT/2ML IM SUSP
1.2000 10*6.[IU] | Freq: Once | INTRAMUSCULAR | Status: AC
  Administered 2018-11-01: 1.2 10*6.[IU] via INTRAMUSCULAR
  Filled 2018-11-01: qty 2

## 2018-11-01 NOTE — ED Triage Notes (Signed)
PT waiting for  meds from Pharmacy . TC to  Pharmacy for meds .

## 2018-11-01 NOTE — ED Provider Notes (Signed)
[facility] EMERGENCY DEPARTMENT Provider Note   CSN: [id number] Arrival date & time: 11/01/18  1054    History   Chief Complaint Chief Complaint  Patient presents with  . Sore Throat  . Fever    HPI Kathleen Zhang is a 23 y.o. female.     The history is provided by the patient.  Sore Throat  This is a new problem. The current episode started 6 to 12 hours ago. The problem occurs constantly. The problem has been rapidly improving. Pertinent negatives include no chest pain, no abdominal pain, no headaches and no shortness of breath. Nothing aggravates the symptoms. Nothing relieves the symptoms. She has tried nothing for the symptoms. The treatment provided mild relief.    Past Medical History:  Diagnosis Date  . Medical history non-contributory     Patient Active Problem List   Diagnosis Date Noted  . Pregnancy-related symptom 01/01/2018    Past Surgical History:  Procedure Laterality Date  . NO PAST SURGERIES       OB History    Gravida  3   Para  1   Term  1   Preterm      AB  1   Living  1     SAB      TAB  1   Ectopic      Multiple  0   Live Births  1            Home Medications    Prior to Admission medications   Medication Sig Start Date End Date Taking? Authorizing Provider  Norgestimate-Ethinyl Estradiol Triphasic 0.18/0.215/0.25 MG-35 MCG tablet Take 1 tablet by mouth daily. Patient not taking: Reported on 05/19/2018 12/22/16    Phenix, MD  ondansetron (ZOFRAN ODT) 4 MG disintegrating tablet Take 1 tablet (4 mg total) by mouth every 8 (eight) hours as needed. 09/18/18   Pricilla Loveless, MD  Prenat-FeCbn-FeAspGl-FA-Omega (OB COMPLETE PETITE) 35-5-1-200 MG CAPS Take 1 tablet by mouth daily. Patient not taking: Reported on 05/19/2018 06/04/16   Roe Coombs, CNM    Family History No family history on file.  Social History Social History   Tobacco Use  . Smoking status: Never Smoker  . Smokeless  tobacco: Never Used  Substance Use Topics  . Alcohol use: No  . Drug use: No     Allergies   Patient has no known allergies.   Review of Systems Review of Systems  Constitutional: Negative for chills and fever.  HENT: Negative for ear pain, sore throat, tinnitus and trouble swallowing.   Eyes: Negative for pain and visual disturbance.  Respiratory: Negative for cough and shortness of breath.   Cardiovascular: Negative for chest pain and palpitations.  Gastrointestinal: Negative for abdominal pain and vomiting.  Genitourinary: Negative for dysuria and hematuria.  Musculoskeletal: Negative for arthralgias and back pain.  Skin: Negative for color change and rash.  Neurological: Negative for seizures, syncope and headaches.  All other systems reviewed and are negative.    Physical Exam Updated Vital Signs  ED Triage Vitals  Enc Vitals Group     BP 11/01/18 1059 (!) 128/51     Pulse Rate 11/01/18 1059 (!) 103     Resp 11/01/18 1059 16     Temp 11/01/18 1059 99.3 F (37.4 C)     Temp Source 11/01/18 1059 Oral     SpO2 11/01/18 1059 100 %     Weight 11/01/18 1108 145 lb (65.8 kg)  Height 11/01/18 1108 5\' 7"  (1.702 m)     Head Circumference --      Peak Flow --      Pain Score 11/01/18 1108 4     Pain Loc --      Pain Edu? --      Excl. in GC? --     Physical Exam Vitals signs and nursing note reviewed.  Constitutional:      General: She is not in acute distress.    Appearance: She is well-developed. She is not ill-appearing.  HENT:     Head: Normocephalic and atraumatic.     Nose: No congestion or rhinorrhea.     Mouth/Throat:     Mouth: Mucous membranes are moist. No oral lesions.     Pharynx: No pharyngeal swelling, oropharyngeal exudate or posterior oropharyngeal erythema.     Tonsils: No tonsillar exudate or tonsillar abscesses. 0 on the right. 0 on the left.  Eyes:     Conjunctiva/sclera: Conjunctivae normal.  Neck:     Musculoskeletal: Neck supple.   Cardiovascular:     Rate and Rhythm: Normal rate and regular rhythm.     Heart sounds: Normal heart sounds. No murmur.  Pulmonary:     Effort: Pulmonary effort is normal. No respiratory distress.     Breath sounds: Normal breath sounds.  Skin:    General: Skin is warm and dry.  Neurological:     Mental Status: She is alert.      ED Treatments / Results  Labs (all labs ordered are listed, but only abnormal results are displayed) Labs Reviewed  GROUP A STREP BY PCR - Abnormal; Notable for the following components:      Result Value   Group A Strep by PCR DETECTED (*)    All other components within normal limits    EKG None  Radiology No results found.  Procedures Procedures (including critical care time)  Medications Ordered in ED Medications  penicillin g procaine-penicillin g benzathine (BICILLIN-CR) injection 600000-600000 units (has no administration in time range)  dexamethasone (DECADRON) tablet 10 mg (10 mg Oral Given 11/01/18 1119)  acetaminophen (TYLENOL) tablet 650 mg (650 mg Oral Given 11/01/18 1120)     Initial Impression / Assessment and Plan / ED Course  I have reviewed the triage vital signs and the nursing notes.  Pertinent labs & imaging results that were available during my care of the patient were reviewed by me and considered in my medical decision making (see chart for details).     Kathleen Corwinshia Yeley is a 23 year old female w/ no significant medical history who presents to the ED with sore throat.  Patient with overall unremarkable vitals.  No fever.  Patient with sore throat since this morning.  Possibly allergies.  History of strep pharyngitis in the past.  There is no signs of throat infection on exam.  No exudates.  Strep screen was positive and pt treat with bicillin shot.  Patient was given Decadron, Tylenol.  No concern for abscess or other oral pharyngeal pathology.  Given return precautions and discharged from ED in good condition.  This chart was  dictated using voice recognition software.  Despite best efforts to proofread,  errors can occur which can change the documentation meaning.    Final Clinical Impressions(s) / ED Diagnoses   Final diagnoses:  Strep pharyngitis    ED Discharge Orders    None       Virgina NorfolkCuratolo, Cherylyn Sundby, DO 11/01/18 1256

## 2018-11-01 NOTE — Discharge Instructions (Addendum)
You have been treated for strep pharyngitis.  Continue good handwashing at home.  Continue Tylenol Motrin for fever as needed.

## 2018-11-01 NOTE — ED Notes (Signed)
Patient verbalizes understanding of discharge instructions . Opportunity for questions and answers were provided . Armband removed by staff ,Pt discharged from ED. W/C  offered at D/C  and Declined W/C at D/C and was escorted to lobby by RN.  

## 2018-11-01 NOTE — ED Triage Notes (Signed)
Pt. Stated, Kathleen Zhang had a sore throat and fever since this morning.

## 2019-02-14 ENCOUNTER — Inpatient Hospital Stay (HOSPITAL_COMMUNITY)
Admission: AD | Admit: 2019-02-14 | Discharge: 2019-02-15 | Disposition: A | Discharge status: Home / Self Care | Payer: BLUE CROSS/BLUE SHIELD | Attending: Obstetrics and Gynecology | Admitting: Obstetrics and Gynecology

## 2019-02-14 ENCOUNTER — Other Ambulatory Visit: Payer: Self-pay

## 2019-02-14 ENCOUNTER — Encounter (HOSPITAL_COMMUNITY): Payer: Self-pay

## 2019-02-14 DIAGNOSIS — Z3A01 Less than 8 weeks gestation of pregnancy: Secondary | ICD-10-CM | POA: Diagnosis not present

## 2019-02-14 DIAGNOSIS — O200 Threatened abortion: Secondary | ICD-10-CM

## 2019-02-14 DIAGNOSIS — O209 Hemorrhage in early pregnancy, unspecified: Secondary | ICD-10-CM | POA: Diagnosis present

## 2019-02-14 DIAGNOSIS — B9689 Other specified bacterial agents as the cause of diseases classified elsewhere: Secondary | ICD-10-CM | POA: Diagnosis not present

## 2019-02-14 DIAGNOSIS — O23591 Infection of other part of genital tract in pregnancy, first trimester: Secondary | ICD-10-CM | POA: Diagnosis not present

## 2019-02-14 DIAGNOSIS — O3680X0 Pregnancy with inconclusive fetal viability, not applicable or unspecified: Secondary | ICD-10-CM

## 2019-02-14 HISTORY — DX: Depression, unspecified: F32.A

## 2019-02-14 HISTORY — DX: Headache, unspecified: R51.9

## 2019-02-14 HISTORY — DX: Contact with and (suspected) exposure to infections with a predominantly sexual mode of transmission: Z20.2

## 2019-02-14 HISTORY — DX: Candidiasis of vulva and vagina: B37.3

## 2019-02-14 HISTORY — DX: Anxiety disorder, unspecified: F41.9

## 2019-02-14 HISTORY — DX: Acute candidiasis of vulva and vagina: B37.31

## 2019-02-14 HISTORY — DX: Other specified bacterial agents as the cause of diseases classified elsewhere: B96.89

## 2019-02-14 LAB — CBC
HCT: 38.9 % (ref 36.0–46.0)
Hemoglobin: 12.8 g/dL (ref 12.0–15.0)
MCH: 27.8 pg (ref 26.0–34.0)
MCHC: 32.9 g/dL (ref 30.0–36.0)
MCV: 84.4 fL (ref 80.0–100.0)
Platelets: 230 10*3/uL (ref 150–400)
RBC: 4.61 MIL/uL (ref 3.87–5.11)
RDW: 13.2 % (ref 11.5–15.5)
WBC: 7 10*3/uL (ref 4.0–10.5)
nRBC: 0 % (ref 0.0–0.2)

## 2019-02-14 LAB — URINALYSIS, ROUTINE W REFLEX MICROSCOPIC
Bacteria, UA: NONE SEEN
Bilirubin Urine: NEGATIVE
Glucose, UA: NEGATIVE mg/dL
Ketones, ur: 20 mg/dL — AB
Leukocytes,Ua: NEGATIVE
Nitrite: NEGATIVE
Protein, ur: 30 mg/dL — AB
RBC / HPF: >50 RBC/hpf — ABNORMAL HIGH (ref 0–5)
Specific Gravity, Urine: 1.024 (ref 1.005–1.030)
pH: 6 (ref 5.0–8.0)

## 2019-02-14 LAB — WET PREP, GENITAL
Sperm: NONE SEEN
Trich, Wet Prep: NONE SEEN
Yeast Wet Prep HPF POC: NONE SEEN

## 2019-02-14 LAB — POCT PREGNANCY, URINE: Preg Test, Ur: NEGATIVE

## 2019-02-14 LAB — HCG, QUANTITATIVE, PREGNANCY: hCG, Beta Chain, Quant, S: 12 m[IU]/mL — ABNORMAL HIGH (ref <5)

## 2019-02-14 NOTE — MAU Provider Note (Signed)
Chief Complaint: Vaginal Bleeding   First Provider Initiated Contact with Patient 02/14/19 2240     SUBJECTIVE HPI: Kathleen Zhang is a 23 y.o. G3P1011 at Unknown who presents to Maternity Admissions reporting vaginal bleeding. Had a positive HPT last week. Started having some bleeding today. This morning was light spotting but has increased & has been filling up panty liners. Also reports some lower abdominal cramping. Complains of clumpy yellow discharge. Denies itching or irritation. No recent intercourse. Went to urgent care earlier today & had a positive pregnancy test with a "light line".    Location: abdomen Quality: cramping Severity: 3/10 on pain scale Duration: 1 day Timing: intermittent Modifying factors: none Associated signs and symptoms: vaginal bleeding  Past Medical History:  Diagnosis Date  . Anxiety   . BV (bacterial vaginosis)   . Chlamydia contact, treated   . Depression   . Headache   . Medical history non-contributory   . Yeast vaginitis    OB History  Gravida Para Term Preterm AB Living  3 1 1   1 1   SAB TAB Ectopic Multiple Live Births    1   0 1    # Outcome Date GA Lbr Len/2nd Weight Sex Delivery Anes PTL Lv  3 Current           2 TAB 12/2017          1 Term 11/10/16 [redacted]w[redacted]d 06:18 / 00:23 3104 g F Vag-Spont None  LIV     Birth Comments: [id number]   Past Surgical History:  Procedure Laterality Date  . NO PAST SURGERIES     Social History   Socioeconomic History  . Marital status: Single    Spouse name: Not on file  . Number of children: Not on file  . Years of education: Not on file  . Highest education level: Not on file  Occupational History  . Not on file  Social Needs  . Financial resource strain: Not on file  . Food insecurity    Worry: Not on file    Inability: Not on file  . Transportation needs    Medical: Not on file    Non-medical: Not on file  Tobacco Use  . Smoking status: Never Smoker  . Smokeless tobacco: Never Used   Substance and Sexual Activity  . Alcohol use: No  . Drug use: No  . Sexual activity: Yes    Birth control/protection: None    Comment: last sex 21 Jul 20  Lifestyle  . Physical activity    Days per week: Not on file    Minutes per session: Not on file  . Stress: Not on file  Relationships  . Social Herbalist on phone: Not on file    Gets together: Not on file    Attends religious service: Not on file    Active member of club or organization: Not on file    Attends meetings of clubs or organizations: Not on file    Relationship status: Not on file  . Intimate partner violence    Fear of current or ex partner: Not on file    Emotionally abused: Not on file    Physically abused: Not on file    Forced sexual activity: Not on file  Other Topics Concern  . Not on file  Social History Narrative  . Not on file   No family history on file. No current facility-administered medications on file prior to encounter.  No current outpatient medications on file prior to encounter.   No Known Allergies  I have reviewed patient's Past Medical Hx, Surgical Hx, Family Hx, Social Hx, medications and allergies.   Review of Systems  Constitutional: Negative.   Gastrointestinal: Positive for abdominal pain. Negative for constipation, diarrhea, nausea and vomiting.  Genitourinary: Positive for vaginal bleeding and vaginal discharge. Negative for dysuria.    OBJECTIVE Patient Vitals for the past 24 hrs:  BP Temp Temp src Pulse Resp SpO2 Height Weight  02/15/19 0021 (!) 126/92 - - 93 - - - -  02/14/19 2132 (!) 125/93 98.6 F (37 C) Oral 82 16 100 % 5\' 7"  (1.702 m) 65.3 kg   Constitutional: Well-developed, well-nourished female in no acute distress.  Cardiovascular: normal rate & rhythm, no murmur Respiratory: normal rate and effort. Lung sounds clear throughout GI: Abd soft, non-tender, Pos BS x 4. No guarding or rebound tenderness MS: Extremities nontender, no edema, normal  ROM Neurologic: Alert and oriented x 4.      LAB RESULTS Results for orders placed or performed during the hospital encounter of 02/14/19 (from the past 24 hour(s))  Pregnancy, urine POC     Status: None   Collection Time: 02/14/19 10:12 PM  Result Value Ref Range   Preg Test, Ur NEGATIVE NEGATIVE  Urinalysis, Routine w reflex microscopic     Status: Abnormal   Collection Time: 02/14/19 10:13 PM  Result Value Ref Range   Color, Urine YELLOW YELLOW   APPearance CLEAR CLEAR   Specific Gravity, Urine 1.024 1.005 - 1.030   pH 6.0 5.0 - 8.0   Glucose, UA NEGATIVE NEGATIVE mg/dL   Hgb urine dipstick LARGE (A) NEGATIVE   Bilirubin Urine NEGATIVE NEGATIVE   Ketones, ur 20 (A) NEGATIVE mg/dL   Protein, ur 30 (A) NEGATIVE mg/dL   Nitrite NEGATIVE NEGATIVE   Leukocytes,Ua NEGATIVE NEGATIVE   RBC / HPF >50 (H) 0 - 5 RBC/hpf   WBC, UA 6-10 0 - 5 WBC/hpf   Bacteria, UA NONE SEEN NONE SEEN   Squamous Epithelial / LPF 0-5 0 - 5   Mucus PRESENT   Wet prep, genital     Status: Abnormal   Collection Time: 02/14/19 11:04 PM   Specimen: Vaginal  Result Value Ref Range   Yeast Wet Prep HPF POC NONE SEEN NONE SEEN   Trich, Wet Prep NONE SEEN NONE SEEN   Clue Cells Wet Prep HPF POC PRESENT (A) NONE SEEN   WBC, Wet Prep HPF POC MODERATE (A) NONE SEEN   Sperm NONE SEEN   CBC     Status: None   Collection Time: 02/14/19 11:14 PM  Result Value Ref Range   WBC 7.0 4.0 - 10.5 K/uL   RBC 4.61 3.87 - 5.11 MIL/uL   Hemoglobin 12.8 12.0 - 15.0 g/dL   HCT 04.538.9 40.936.0 - 81.146.0 %   MCV 84.4 80.0 - 100.0 fL   MCH 27.8 26.0 - 34.0 pg   MCHC 32.9 30.0 - 36.0 g/dL   RDW 91.413.2 78.211.5 - 95.615.5 %   Platelets 230 150 - 400 K/uL   nRBC 0.0 0.0 - 0.2 %  hCG, quantitative, pregnancy     Status: Abnormal   Collection Time: 02/14/19 11:14 PM  Result Value Ref Range   hCG, Beta Chain, Quant, S 12 (H) <5 mIU/mL    IMAGING No results found.  MAU COURSE Orders Placed This Encounter  Procedures  . Wet prep, genital   . Urinalysis, Routine  w reflex microscopic  . CBC  . hCG, quantitative, pregnancy  . Pregnancy, urine POC  . Discharge patient   Meds ordered this encounter  Medications  . metroNIDAZOLE (FLAGYL) 500 MG tablet    Sig: Take 1 tablet (500 mg total) by mouth 2 (two) times daily.    Dispense:  14 tablet    Refill:  0    Order Specific Question:   Supervising Provider    Answer:   Roma BingPICKENS, CHARLIE [[id number]]    MDM UPT negative HCG= 12 RH positive  Reviewed with Dr. Vergie LivingPickens. Ultrasound not indicated today. Will get repeat HCG on Thursday  ASSESSMENT 1. Threatened miscarriage   2. Pregnancy of unknown anatomic location   3. Bacterial vaginosis     PLAN Discharge home in stable condition. SAB vs ectopic precautions GC/CT pending Rx flagyl Msg sent to [facility] for stat HCG on Thursday  Follow-up Information    [facility].   Why: Will need repeat blood work on thursday Contact information: [address] [place] [phone]       [facility] Follow up.   Specialty: Obstetrics and Gynecology Why: return for worsening symptoms Contact information: [address] [id number] [place] [phone]         Allergies as of 02/15/2019   No Known Allergies     Medication List    STOP taking these medications   Norgestimate-Ethinyl Estradiol Triphasic 0.18/0.215/0.25 MG-35 MCG tablet   OB Complete Petite 35-5-1-200 MG Caps   ondansetron 4 MG disintegrating tablet Commonly known as: Zofran ODT     TAKE these medications   metroNIDAZOLE 500 MG tablet Commonly known as: FLAGYL Take 1 tablet (500 mg total) by mouth 2 (two) times daily.        Judeth HornLawrence, Maddex Garlitz, NP 02/15/2019  1:14 AM

## 2019-02-14 NOTE — MAU Note (Addendum)
Pt reports she missed her period and had 2 +upts at home. Started having spotting this morning but getting heavier. States it fills up a panty liner. Changing panty liner 2 times today within the last few hours. Pt reports light cramping-rates 3/10. LMP: 01/14/2019. Pt seen at [facility] recently, has confirmation of pregnancy paperwork.

## 2019-02-15 DIAGNOSIS — O200 Threatened abortion: Secondary | ICD-10-CM

## 2019-02-15 MED ORDER — METRONIDAZOLE 500 MG PO TABS: 500.0000 mg | ORAL_TABLET | Freq: Two times a day (BID) | ORAL | 0 refills | Status: DC

## 2019-02-15 NOTE — Discharge Instructions (Signed)
Return to care   If you have heavier bleeding that soaks through more that 2 pads per hour for an hour or more  If you bleed so much that you feel like you might pass out or you do pass out  If you have significant abdominal pain that is not improved with Tylenol   If you develop a fever > 100.5      Bacterial Vaginosis  Bacterial vaginosis is an infection of the vagina. It happens when too many normal germs (healthy bacteria) grow in the vagina. This infection puts you at risk for infections from sex (STIs). Treating this infection can lower your risk for some STIs. You should also treat this if you are pregnant. It can cause your baby to be born early. Follow these instructions at home: Medicines  Take over-the-counter and prescription medicines only as told by your doctor.  Take or use your antibiotic medicine as told by your doctor. Do not stop taking or using it even if you start to feel better. General instructions  If you your sexual partner is a woman, tell her that you have this infection. She needs to get treatment if she has symptoms. If you have a female partner, he does not need to be treated.  During treatment: ? Avoid sex. ? Do not douche. ? Avoid alcohol as told. ? Avoid breastfeeding as told.  Drink enough fluid to keep your pee (urine) clear or pale yellow.  Keep your vagina and butt (rectum) clean. ? Wash the area with warm water every day. ? Wipe from front to back after you use the toilet.  Keep all follow-up visits as told by your doctor. This is important. Preventing this condition  Do not douche.  Use only warm water to wash around your vagina.  Use protection when you have sex. This includes: ? Latex condoms. ? Dental dams.  Limit how many people you have sex with. It is best to only have sex with the same person (be monogamous).  Get tested for STIs. Have your partner get tested.  Wear underwear that is cotton or lined with  cotton.  Avoid tight pants and pantyhose. This is most important in summer.  Do not use any products that have nicotine or tobacco in them. These include cigarettes and e-cigarettes. If you need help quitting, ask your doctor.  Do not use illegal drugs.  Limit how much alcohol you drink. Contact a doctor if:  Your symptoms do not get better, even after you are treated.  You have more discharge or pain when you pee (urinate).  You have a fever.  You have pain in your belly (abdomen).  You have pain with sex.  Your bleed from your vagina between periods. Summary  This infection happens when too many germs (bacteria) grow in the vagina.  Treating this condition can lower your risk for some infections from sex (STIs).  You should also treat this if you are pregnant. It can cause early (premature) birth.  Do not stop taking or using your antibiotic medicine even if you start to feel better. This information is not intended to replace advice given to you by your health care provider. Make sure you discuss any questions you have with your health care provider. Document Released: 04/15/2008 Document Revised: 06/19/2017 Document Reviewed: 03/22/2016 Elsevier Patient Education  2020 [employer].

## 2019-02-16 LAB — GC/CHLAMYDIA PROBE AMP (~~LOC~~) NOT AT ARMC
Chlamydia: NEGATIVE
Neisseria Gonorrhea: NEGATIVE

## 2019-02-17 ENCOUNTER — Other Ambulatory Visit: Payer: Medicaid Other

## 2019-02-21 ENCOUNTER — Other Ambulatory Visit: Payer: Medicaid Other

## 2019-02-22 ENCOUNTER — Other Ambulatory Visit: Payer: Medicaid Other

## 2019-03-27 ENCOUNTER — Encounter (HOSPITAL_COMMUNITY): Payer: Self-pay | Admitting: Emergency Medicine

## 2019-03-27 ENCOUNTER — Other Ambulatory Visit: Payer: Self-pay

## 2019-03-27 ENCOUNTER — Emergency Department (HOSPITAL_COMMUNITY)
Admission: EM | Admit: 2019-03-27 | Discharge: 2019-03-28 | Disposition: A | Discharge status: Other Acute Inpatient Hospital | Payer: BC Managed Care – PPO | Source: Home / Self Care | Attending: Emergency Medicine | Admitting: Emergency Medicine

## 2019-03-27 DIAGNOSIS — F332 Major depressive disorder, recurrent severe without psychotic features: Secondary | ICD-10-CM | POA: Insufficient documentation

## 2019-03-27 DIAGNOSIS — R45851 Suicidal ideations: Secondary | ICD-10-CM

## 2019-03-27 DIAGNOSIS — Z20828 Contact with and (suspected) exposure to other viral communicable diseases: Secondary | ICD-10-CM | POA: Insufficient documentation

## 2019-03-27 LAB — CBC
HCT: 36.3 % (ref 36.0–46.0)
Hemoglobin: 11.3 g/dL — ABNORMAL LOW (ref 12.0–15.0)
MCH: 27.1 pg (ref 26.0–34.0)
MCHC: 31.1 g/dL (ref 30.0–36.0)
MCV: 87.1 fL (ref 80.0–100.0)
Platelets: 231 10*3/uL (ref 150–400)
RBC: 4.17 MIL/uL (ref 3.87–5.11)
RDW: 14.2 % (ref 11.5–15.5)
WBC: 4.3 10*3/uL (ref 4.0–10.5)
nRBC: 0 % (ref 0.0–0.2)
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
## 2019-03-28 ENCOUNTER — Other Ambulatory Visit: Payer: Self-pay

## 2019-03-28 ENCOUNTER — Encounter (HOSPITAL_COMMUNITY): Payer: Self-pay

## 2019-03-28 ENCOUNTER — Inpatient Hospital Stay (HOSPITAL_COMMUNITY)
Admission: AD | Admit: 2019-03-28 | Discharge: 2019-03-30 | DRG: 885 | Disposition: A | Discharge status: Home / Self Care | Payer: BC Managed Care – PPO | Attending: Psychiatry | Admitting: Psychiatry

## 2019-03-28 DIAGNOSIS — F1210 Cannabis abuse, uncomplicated: Secondary | ICD-10-CM | POA: Diagnosis present

## 2019-03-28 DIAGNOSIS — Z20828 Contact with and (suspected) exposure to other viral communicable diseases: Secondary | ICD-10-CM | POA: Diagnosis present

## 2019-03-28 DIAGNOSIS — F10129 Alcohol abuse with intoxication, unspecified: Secondary | ICD-10-CM | POA: Diagnosis present

## 2019-03-28 DIAGNOSIS — Y906 Blood alcohol level of 120-199 mg/100 ml: Secondary | ICD-10-CM | POA: Diagnosis present

## 2019-03-28 DIAGNOSIS — F333 Major depressive disorder, recurrent, severe with psychotic symptoms: Secondary | ICD-10-CM | POA: Diagnosis present

## 2019-03-28 DIAGNOSIS — F332 Major depressive disorder, recurrent severe without psychotic features: Secondary | ICD-10-CM

## 2019-03-28 DIAGNOSIS — R45851 Suicidal ideations: Secondary | ICD-10-CM | POA: Diagnosis present

## 2019-03-28 LAB — COMPREHENSIVE METABOLIC PANEL
ALT: 22 U/L (ref 0–44)
AST: 35 U/L (ref 15–41)
Albumin: 4.6 g/dL (ref 3.5–5.0)
Alkaline Phosphatase: 54 U/L (ref 38–126)
Anion gap: 12 (ref 5–15)
BUN: 12 mg/dL (ref 6–20)
CO2: 21 mmol/L — ABNORMAL LOW (ref 22–32)
Calcium: 9 mg/dL (ref 8.9–10.3)
Chloride: 110 mmol/L (ref 98–111)
Creatinine, Ser: 0.95 mg/dL (ref 0.44–1.00)
GFR calc Af Amer: >60 mL/min (ref >60)
GFR calc non Af Amer: >60 mL/min (ref >60)
Glucose, Bld: 114 mg/dL — ABNORMAL HIGH (ref 70–99)
Potassium: 3.5 mmol/L (ref 3.5–5.1)
Sodium: 143 mmol/L (ref 135–145)
Total Bilirubin: 0.7 mg/dL (ref 0.3–1.2)
Total Protein: 8.4 g/dL — ABNORMAL HIGH (ref 6.5–8.1)

## 2019-03-28 LAB — RAPID URINE DRUG SCREEN, HOSP PERFORMED
Amphetamines: NOT DETECTED
Barbiturates: NOT DETECTED
Benzodiazepines: NOT DETECTED
Cocaine: NOT DETECTED
Opiates: NOT DETECTED
Tetrahydrocannabinol: POSITIVE — AB

## 2019-03-28 LAB — I-STAT BETA HCG BLOOD, ED (MC, WL, AP ONLY): I-stat hCG, quantitative: <5 m[IU]/mL (ref <5)

## 2019-03-28 LAB — SARS CORONAVIRUS 2 BY RT PCR (HOSPITAL ORDER, PERFORMED IN ~~LOC~~ HOSPITAL LAB): SARS Coronavirus 2: NEGATIVE

## 2019-03-28 LAB — SALICYLATE LEVEL: Salicylate Lvl: <7 mg/dL (ref 2.8–30.0)

## 2019-03-28 LAB — ACETAMINOPHEN LEVEL: Acetaminophen (Tylenol), Serum: <10 ug/mL — ABNORMAL LOW (ref 10–30)

## 2019-03-28 LAB — ETHANOL: Alcohol, Ethyl (B): 148 mg/dL — ABNORMAL HIGH (ref <10)

## 2019-03-28 MED ORDER — ACETAMINOPHEN 325 MG PO TABS: 650.0000 mg | ORAL_TABLET | Freq: Four times a day (QID) | ORAL | Status: DC | PRN

## 2019-03-28 MED ORDER — HYDROXYZINE HCL 25 MG PO TABS: 25.0000 mg | ORAL_TABLET | Freq: Three times a day (TID) | ORAL | Status: DC | PRN

## 2019-03-28 MED ORDER — ALUM & MAG HYDROXIDE-SIMETH 200-200-20 MG/5ML PO SUSP: 30.0000 mL | ORAL | Status: DC | PRN

## 2019-03-28 MED ORDER — MAGNESIUM HYDROXIDE 400 MG/5ML PO SUSP: 30.0000 mL | Freq: Every day | ORAL | Status: DC | PRN

## 2019-03-28 MED ORDER — ACETAMINOPHEN 500 MG PO TABS
500.0000 mg | ORAL_TABLET | Freq: Once | ORAL | Status: AC
  Administered 2019-03-28: 500 mg via ORAL
  Filled 2019-03-28: qty 1

## 2019-03-28 MED ORDER — TRAZODONE HCL 50 MG PO TABS: 50.0000 mg | ORAL_TABLET | Freq: Every evening | ORAL | Status: DC | PRN
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
## 2019-03-28 NOTE — ED Notes (Signed)
Pt off unit to [facility] per provider. Pt alert, calm, cooperative, no s/s of distress. DC information given to [employer] for [facility] facility . Belongings given to [facility] for [facility] facility . Pt ambulatory off unit escorted with [employer]. Pt transported by [employer],

## 2019-03-28 NOTE — Progress Notes (Signed)
Adult Psychoeducational Group Note  Date:  03/28/2019 Time:  9:37 PM  Group Topic/Focus:  Wrap-Up Group:   The focus of this group is to help patients review their daily goal of treatment and discuss progress on daily workbooks.  Participation Level:  Active  Participation Quality:  Appropriate  Affect:  Appropriate  Cognitive:  Appropriate  Insight: Appropriate  Engagement in Group:  Engaged  Modes of Intervention:  Discussion  Additional Comments:  Pt attended group and said that her day was a 6. Her goal for today was to learn how to make better choices as a mother and woman. She is still working on that goal.   Iona Coach Yeimi Debnam 03/23/7341, 9:37 PM
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
## 2019-03-28 NOTE — Progress Notes (Addendum)
Patient accepted for involuntary inpatient admission to [facility]. Patient assessed briefly via tele assessment, verbalizes "I don't remember saying I was going to kill myself, I was drinking wine and using weed. I don't usually use alcohol or weed." Patient denies HI and AVH.  Patient seen by telemedicine for psychiatric evaluation, chart reviewed and case discussed with the physician extender and developed treatment plan. Reviewed the information documented and agree with the treatment plan.  Buford Dresser, DO 03/28/19 5:08 PM
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
## 2019-03-28 NOTE — ED Notes (Signed)
Pt calm, cooperative. Guarded. Sleeping

## 2019-03-28 NOTE — BH Assessment (Signed)
[facility] Assessment Progress Note  Per Buford Dresser, DO, this pt requires psychiatric hospitalization.  Heather, RN, has assigned pt to [facility] Rm 302-1; please note that this represents a change in assignment from earlier today.  Pt presents under IVC initiated by law enforcement, and upheld by [employer], LCSW, and IVC documents have been sent to [facility].  Pt's nurse, Eustaquio Maize, has been notified, and agrees to call report to [phone].  Pt is to be transported via Event organiser.   Jalene Mullet, [place] Coordinator [phone]

## 2019-03-28 NOTE — Tx Team (Signed)
Initial Treatment Plan 03/28/2019 3:46 PM Kathleen Zhang EVO:[id number]    PATIENT STRESSORS: Financial difficulties Marital or family conflict Occupational concerns   PATIENT STRENGTHS: Ability for insight Active sense of humor Average or above average intelligence Capable of independent living Communication skills General fund of knowledge   PATIENT IDENTIFIED PROBLEMS: "issues with family"  "need support'                   DISCHARGE CRITERIA:  Ability to meet basic life and health needs Adequate post-discharge living arrangements Improved stabilization in mood, thinking, and/or behavior Medical problems require only outpatient monitoring  PRELIMINARY DISCHARGE PLAN: Attend PHP/IOP Participate in family therapy Placement in alternative living arrangements Return to previous work or school arrangements  PATIENT/FAMILY INVOLVEMENT: This treatment plan has been presented to and reviewed with the patient, Kathleen Zhang.  The patient and family have been given the opportunity to ask questions and make suggestions.  Baron Sane, RN 03/28/2019, 3:46 PM

## 2019-03-28 NOTE — ED Notes (Signed)
Pt upset about being in hospital . Wants to go home. Pt denied SI/HI.

## 2019-03-28 NOTE — ED Notes (Signed)
TTS machine at bedside. 
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
## 2019-03-29 DIAGNOSIS — F332 Major depressive disorder, recurrent severe without psychotic features: Secondary | ICD-10-CM

## 2019-03-29 LAB — TSH: TSH: 0.87 u[IU]/mL (ref 0.350–4.500)

## 2019-03-29 MED ORDER — PRENATAL MULTIVITAMIN CH
1.0000 | ORAL_TABLET | Freq: Every day | ORAL | Status: DC
  Administered 2019-03-29 – 2019-03-30 (×2): 1 via ORAL
  Filled 2019-03-29 (×3): qty 1

## 2019-03-29 MED ORDER — BUSPIRONE HCL 15 MG PO TABS
15.0000 mg | ORAL_TABLET | Freq: Three times a day (TID) | ORAL | Status: DC
  Administered 2019-03-29 – 2019-03-30 (×5): 15 mg via ORAL
  Filled 2019-03-29 (×3): qty 1
  Filled 2019-03-29: qty 3
  Filled 2019-03-29 (×7): qty 1

## 2019-03-29 MED ORDER — FLUOXETINE HCL 20 MG PO CAPS
20.0000 mg | ORAL_CAPSULE | Freq: Every day | ORAL | Status: DC
  Administered 2019-03-29 – 2019-03-30 (×2): 20 mg via ORAL
  Filled 2019-03-29 (×5): qty 1
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
## 2019-03-29 NOTE — Progress Notes (Signed)
Patient shared in group that she had a good day since she found out the medical results (?). Her goal for tomorrow is to remain focused and positive.

## 2019-03-29 NOTE — Plan of Care (Signed)
Nurse discussed anxiety, depression and coping skills with patient.  

## 2019-03-29 NOTE — Progress Notes (Signed)
Recreation Therapy Notes  Animal-Assisted Activity (AAA) Program Checklist/Progress Notes  Date: 9.8.20 Time: 11 Location: 51 Film/video editor  AAA/T Program Assumption of Risk Form signed by Teacher, music or Parent Legal Guardian YES   Patient is free of allergies or sever asthma YES   Patient reports no fear of animals YES  Patient reports no history of cruelty to animals YES   Patient understands his/her participation is voluntary YES  Patient washes hands before animal contact YES   Patient washes hands after animal contact YES   Behavioral Response: Engaged  Education: Contractor, Appropriate Animal Interaction   Education Outcome: Acknowledges understanding/In group clarification offered/Needs additional education.   Clinical Observations/Feedback:  Pt attended and participated in group activity.    Victorino Sparrow, LRT/CTRS        Victorino Sparrow A 03/29/2019 3:35 PM
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
## 2019-03-29 NOTE — Progress Notes (Signed)
D:  Patient's self inventory sheet, patient sleeps good, no sleep medication.  Poor appetite, low energy level, good concentration.  Rated depression 6, denied hopeless, rated anxiety 4.  Denied withdrawals.  Denied SI.  Denied physical problems.  Denied physical pain.  Goal is stay strong.  Plans to stay positive and calm.  Does have discharge plans. A:  Medications administered per MD orders.  Emotional support and encouragement given patient. R:  Denied SI and HI, contracts for safety.  Denied A/V hallucinations.  Safety maintained with 15 minute checks.
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
## 2019-03-30 MED ORDER — PRENATAL MULTIVITAMIN CH: 1.0000 | ORAL_TABLET | Freq: Every day | ORAL | 2 refills | Status: AC

## 2019-03-30 MED ORDER — FLUOXETINE HCL 20 MG PO CAPS: 20.0000 mg | ORAL_CAPSULE | Freq: Every day | ORAL | 1 refills | Status: AC

## 2019-03-30 MED ORDER — BUSPIRONE HCL 15 MG PO TABS: 15.0000 mg | ORAL_TABLET | Freq: Three times a day (TID) | ORAL | 2 refills | Status: AC
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
## 2019-03-30 NOTE — BHH Group Notes (Signed)
Occupational Therapy Group Note  Date:  03/30/2019 Time:  10:39 AM  Group Topic/Focus:  Self Esteem Action Plan:   The focus of this group is to help patients create a plan to continue to build self-esteem after discharge.  Participation Level:  Active  Participation Quality:  Appropriate  Affect:  Depressed  Cognitive:  Appropriate  Insight: Improving  Engagement in Group:  Engaged  Modes of Intervention:  Activity, Discussion, Education and Socialization  Additional Comments:   S: Overthinking and getting overwhelmed with responsibilities   O: OT tx with focus on self esteem building this date. Education given on definition of self esteem, with both causes of low and high self esteem identified. Activity given for pt to identify a positive/aspiring trait for each letter of the alphabet. Pt to work with peers to help complete activity and build positive thinking.   A: Pt presents with depressed affect, engaged and participatory throughout session. Pt shares that becoming overwhelmed with committing to too many obligations and not caring for self. She shares that positive affirmations will help increase her self esteem.  P: OT group will be x1 per week while pt inpatient.  Zenovia Jarred, MSOT, OTR/L Behavioral Health OT/ Acute Relief OT [facility] Office: [phone]  Zenovia Jarred 03/30/2019, 10:39 AM
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
## 2019-03-30 NOTE — Progress Notes (Signed)
Patient ID: Kathleen Zhang, female   DOB: 1995/11/21, 23 y.o.   MRN: [id number] Pt d/c to home with family. D/c instructions and medications reviewed. Pt verbalizes understanding. Denies s.i.

## 2019-03-30 NOTE — Progress Notes (Signed)
  [facility] Adult Case Management Discharge Plan :  Will you be returning to the same living situation after discharge:  Yes,  home At discharge, do you have transportation home?: Yes,  mom Do you have the ability to pay for your medications: Yes,  private insurance  Release of information consent forms completed and in the chart;  Patient's signature needed at discharge.  Patient to Follow up at: [place], [facility] Follow up.   Specialty: Behavioral Health Why: Social Civil engineer, contracting referred you for mental health services.  Office will contact you with appts Contact information: [place] [address] [phone]           Next level of care provider has access to [place] and Suicide Prevention discussed: Yes,  pt's father     Has patient been referred to the Quitline?: N/A patient is not a smoker  Patient has been referred for addiction treatment: Yes  Trecia Rogers, LCSW 03/30/2019, 2:07 PM

## 2019-03-30 NOTE — Progress Notes (Signed)
Patient ID: Kathleen Zhang, female   DOB: [birth year], 23 y.o.   MRN: [id number] [place] NOVEL CORONAVIRUS (COVID-19) DAILY CHECK-OFF SYMPTOMS - answer yes or no to each - every day NO YES  Have you had a fever in the past 24 hours?  . Fever (Temp > 37.80C / 100F) X   Have you had any of these symptoms in the past 24 hours? . New Cough .  Sore Throat  .  Shortness of Breath .  Difficulty Breathing .  Unexplained Body Aches   X   Have you had any one of these symptoms in the past 24 hours not related to allergies?   . Runny Nose .  Nasal Congestion .  Sneezing   X   If you have had runny nose, nasal congestion, sneezing in the past 24 hours, has it worsened?  X   EXPOSURES - check yes or no X   Have you traveled outside the state in the past 14 days?  X   Have you been in contact with someone with a confirmed diagnosis of COVID-19 or PUI in the past 14 days without wearing appropriate PPE?  X   Have you been living in the same home as a person with confirmed diagnosis of COVID-19 or a PUI (household contact)?    X   Have you been diagnosed with COVID-19?    X              What to do next: Answered NO to all: Answered YES to anything:   Proceed with unit schedule Follow the [facility] Inpatient Flowsheet.

## 2019-03-30 NOTE — [facility] Suicide Risk Assessment (Signed)
[facility] Discharge Suicide Risk Assessment   Principal Problem: <principal problem not specified> Discharge Diagnoses: Active Problems:   Severe recurrent major depression without psychotic features ([place])   Total Time spent with patient: 45 minutes Musculoskeletal: Strength & Muscle Tone: within normal limits Gait & Station: normal Patient leans: N/A  Psychiatric Specialty Exam: Physical Exam  ROS  Blood pressure 125/88, pulse 94, temperature 98.3 F (36.8 C), temperature source Oral, resp. rate 18, height 5\' 7"  (1.702 m), weight 63.5 kg, last menstrual period 03/16/2019, SpO2 100 %, unknown if currently breastfeeding.Body mass index is 21.93 kg/m.  General Appearance: Casual  Eye Contact:  Good  Speech:  Clear and Coherent  Volume:  Normal  Mood:  Euthymic  Affect:  Congruent  Thought Process:  Coherent  Orientation:  Full (Time, Place, and Person)  Thought Content:  Logical  Suicidal Thoughts:  No  Homicidal Thoughts:  No  Memory:  Recent;   Good Remote;   Good  Judgement:  Good  Insight:  Good  Psychomotor Activity:  Normal  Concentration:  Attention Span: Good  Recall:  Good  Fund of Knowledge:  Good  Language:  Good  Akathisia:  Negative  Handed:  Right  AIMS (if indicated):     Assets:  Communication Skills Desire for Improvement  ADL's:  Intact  Cognition:  WNL  Sleep:  Number of Hours: 5      Mental Status Per Nursing Assessment::   On Admission:  NA  Demographic Factors:  NA  Loss Factors: NA  Historical Factors: Impulsivity  Risk Reduction Factors:   Sense of responsibility to family, Religious beliefs about death and Positive social support  Continued Clinical Symptoms:  Alcohol/Substance Abuse/Dependencies  Cognitive Features That Contribute To Risk:  None    Suicide Risk:  Minimal: No identifiable suicidal ideation.  Patients presenting with no risk factors but with morbid ruminations; may be classified as minimal risk based on the  severity of the depressive symptoms  [facility] Follow up.   Specialty: Behavioral Health Why: Social Civil engineer, contracting referred you for mental health services.  Office will contact you with appts Contact information: [address] [place] [address] [phone]           Plan Of Care/Follow-up recommendations:  Activity:  full  Dauna Ziska, MD 03/30/2019, 11:21 AM

## 2019-03-30 NOTE — [facility] Suicide Risk Assessment (Signed)
[place] INPATIENT:  Family/Significant Other Suicide Prevention Education  Suicide Prevention Education:  Education Completed; Pt's father, Kathleen Zhang,  (name of family member/significant other) has been identified by the patient as the family member/significant other with whom the patient will be residing, and identified as the person(s) who will aid the patient in the event of a mental health crisis (suicidal ideations/suicide attempt).  With written consent from the patient, the family member/significant other has been provided the following suicide prevention education, prior to the and/or following the discharge of the patient.  The suicide prevention education provided includes the following:  Suicide risk factors  Suicide prevention and interventions  National Suicide Hotline telephone number  [facility] assessment telephone number  [facility] Emergency Assistance [place] and/or Residential Mobile Crisis Unit telephone number  Request made of family/significant other to:  Remove weapons (e.g., guns, rifles, knives), all items previously/currently identified as safety concern.    Remove drugs/medications (over-the-counter, prescriptions, illicit drugs), all items previously/currently identified as a safety concern.  The family member/significant other verbalizes understanding of the suicide prevention education information provided.  The family member/significant other agrees to remove the items of safety concern listed above.   CSW contacted pt's father, Kathleen Zhang. Pt's father had concerns about the patient's aftercare. Pt's father did not know that she was being discharged but just wanted to make sure that she had aftercare set up.   Kathleen Zhang 03/30/2019, 1:58 PM

## 2019-08-13 ENCOUNTER — Encounter (HOSPITAL_COMMUNITY): Payer: Self-pay | Admitting: Emergency Medicine

## 2019-08-13 ENCOUNTER — Other Ambulatory Visit: Payer: Self-pay

## 2019-08-13 ENCOUNTER — Emergency Department (HOSPITAL_COMMUNITY)
Admission: EM | Admit: 2019-08-13 | Discharge: 2019-08-13 | Disposition: A | Discharge status: Home / Self Care | Payer: BC Managed Care – PPO | Attending: Emergency Medicine | Admitting: Emergency Medicine

## 2019-08-13 DIAGNOSIS — Z8709 Personal history of other diseases of the respiratory system: Secondary | ICD-10-CM | POA: Insufficient documentation

## 2019-08-13 DIAGNOSIS — Z87891 Personal history of nicotine dependence: Secondary | ICD-10-CM | POA: Diagnosis not present

## 2019-08-13 DIAGNOSIS — H16002 Unspecified corneal ulcer, left eye: Secondary | ICD-10-CM | POA: Insufficient documentation

## 2019-08-13 DIAGNOSIS — H5712 Ocular pain, left eye: Secondary | ICD-10-CM | POA: Diagnosis present

## 2019-08-13 DIAGNOSIS — R05 Cough: Secondary | ICD-10-CM | POA: Insufficient documentation

## 2019-08-13 MED ORDER — FLUORESCEIN SODIUM 1 MG OP STRP
2.0000 | ORAL_STRIP | Freq: Once | OPHTHALMIC | Status: AC
  Administered 2019-08-13: 12:00:00 2 via OPHTHALMIC
  Filled 2019-08-13: qty 2

## 2019-08-13 MED ORDER — TETRACAINE HCL 0.5 % OP SOLN
2.0000 [drp] | Freq: Once | OPHTHALMIC | Status: AC
  Administered 2019-08-13: 2 [drp] via OPHTHALMIC
  Filled 2019-08-13: qty 4

## 2019-08-13 MED ORDER — TOBRAMYCIN-DEXAMETHASONE 0.3-0.1 % OP SUSP: 1.0000 [drp] | Freq: Four times a day (QID) | OPHTHALMIC | 0 refills | Status: AC

## 2019-08-13 NOTE — ED Triage Notes (Signed)
C/o "yellow dot" on L eye x 3-4 days with irritation.  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

## 2019-08-13 NOTE — Discharge Instructions (Addendum)
As discussed, I spoke to the eye doctor on call who believes this could be a corneal ulcer. I am sending you home with eye drops. Use 4 times a day. He said he will see you in his office on Monday. Call Monday to schedule an appointment. Do not wear your contacts until you see him in the office. Return to the ER for new or worsening symptoms.

## 2019-08-13 NOTE — ED Provider Notes (Signed)
[facility] EMERGENCY DEPARTMENT Provider Note   CSN: [id number] Arrival date & time: 08/13/19  0944     History Chief Complaint  Patient presents with  . Eye Problem    Kathleen Zhang is a 24 y.o. female with a past medical history significant for anxiety who presents to the ED due to a "yellow dot" in her left eye that has been present for the past 3-4 days. Patient states "it feels like something is in my eye". Denies known injury to left eye. Patient is a contact lens wearer. Patient denies blurry vision, but notes it is difficult to tell since she has not been wearing her contacts for the past 3-4 days. Patient admits to a little bit of clear drainage and intermittent redness of eye. She also admits to slight photophobia. Patient has an appointment with her eye doctor on 08/25/2019. Patient also notes she tested positive for COVID on 1/13 at [facility] with symptom onset on 1/12. She admits to a continued intermittent dry cough which she has been taking cough medication for. Patient denies shortness of breath, chest pain, abdominal pain, fever, chills, diarrhea, vomiting, headaches, sore throat, and nausea.      Past Medical History:  Diagnosis Date  . Anxiety   . BV (bacterial vaginosis)   . Chlamydia contact, treated   . Depression   . Headache   . Medical history non-contributory   . Yeast vaginitis     Patient Active Problem List   Diagnosis Date Noted  . MDD (major depressive disorder), recurrent episode, severe (HCC) 03/28/2019  . Severe recurrent major depression without psychotic features (HCC) 03/28/2019  . Pregnancy-related symptom 01/01/2018    Past Surgical History:  Procedure Laterality Date  . NO PAST SURGERIES       OB History    Gravida  3   Para  1   Term  1   Preterm      AB  1   Living  1     SAB      TAB  1   Ectopic      Multiple  0   Live Births  1           No family history on file.  Social  History   Tobacco Use  . Smoking status: Never Smoker  . Smokeless tobacco: Never Used  Substance Use Topics  . Alcohol use: Yes    Comment: occ  . Drug use: Yes    Types: Marijuana    Home Medications Prior to Admission medications   Medication Sig Start Date End Date Taking? Authorizing Provider  busPIRone (BUSPAR) 15 MG tablet Take 1 tablet (15 mg total) by mouth 3 (three) times daily. 03/30/19   Malvin Johns, MD  FLUoxetine (PROZAC) 20 MG capsule Take 1 capsule (20 mg total) by mouth daily. 03/31/19   Malvin Johns, MD  Prenatal Vit-Fe Fumarate-FA (PRENATAL MULTIVITAMIN) TABS tablet Take 1 tablet by mouth daily at 12 noon. 03/30/19   Malvin Johns, MD  tobramycin-dexamethasone [facility]) ophthalmic solution Place 1 drop into the left eye in the morning, at noon, in the evening, and at bedtime. 08/13/19   Mannie Stabile, PA-C    Allergies    Patient has no known allergies.  Review of Systems   Review of Systems  Constitutional: Negative for chills and fever.  HENT: Negative for congestion, ear pain, rhinorrhea and sore throat.   Eyes: Positive for pain (foreign body sensation),  discharge (clear) and redness. Negative for photophobia, itching and visual disturbance.  Respiratory: Positive for cough (intermittent dry cough). Negative for shortness of breath.   Cardiovascular: Negative for chest pain.  Gastrointestinal: Negative for abdominal pain, diarrhea, nausea and vomiting.    Physical Exam Updated Vital Signs BP 131/68 (BP Location: Right Arm)   Pulse 76   Temp 97.9 F (36.6 C) (Oral)   Resp 16   LMP 08/04/2019   SpO2 100%   Physical Exam Vitals and nursing note reviewed.  Constitutional:      General: She is not in acute distress.    Appearance: She is not ill-appearing.  HENT:     Head: Normocephalic.  Eyes:     General: No scleral icterus.       Right eye: No discharge.        Left eye: No discharge.     Pupils: Pupils are equal, round, and reactive to  light.     Comments: Small circular yellow dot in left iris with mild injected conjunctiva with ciliary infection. PERRL. Right eye pressure 20. Left eye pressure 21. Fluorescein uptake over small area in left eye. Slip lamp performed as well. See photos below.   Cardiovascular:     Rate and Rhythm: Normal rate and regular rhythm.     Pulses: Normal pulses.     Heart sounds: Normal heart sounds. No murmur. No friction rub. No gallop.   Pulmonary:     Effort: Pulmonary effort is normal.     Breath sounds: Normal breath sounds.     Comments: Respirations equal and unlabored, patient able to speak in full sentences, lungs clear to auscultation bilaterally Abdominal:     General: Abdomen is flat. There is no distension.     Palpations: Abdomen is soft.     Tenderness: There is no abdominal tenderness. There is no guarding or rebound.  Musculoskeletal:     Cervical back: Neck supple.     Right lower leg: No edema.     Left lower leg: No edema.     Comments: Able to move all 4 extremities without difficulty.   Skin:    General: Skin is warm and dry.  Neurological:     General: No focal deficit present.     Mental Status: She is alert.         ED Results / Procedures / Treatments   Labs (all labs ordered are listed, but only abnormal results are displayed) Labs Reviewed - No data to display  EKG None  Radiology No results found.  Procedures Procedures (including critical care time)  Medications Ordered in ED Medications  fluorescein ophthalmic strip 2 strip (2 strips Both Eyes Given 08/13/19 1139)  tetracaine (PONTOCAINE) 0.5 % ophthalmic solution 2 drop (2 drops Both Eyes Given 08/13/19 1139)    ED Course  I have reviewed the triage vital signs and the nursing notes.  Pertinent labs & imaging results that were available during my care of the patient were reviewed by me and considered in my medical decision making (see chart for details).  Clinical Course as of Aug 12 1399  Sat Aug 13, 2019  1113 Spoke to Dr. Genia Del with Ophthalmology who recommends Tobradex QID with outpatient follow-up with him on Monday.    [CA]    Clinical Course User Index [CA] Mannie Stabile, PA-C   MDM Rules/Calculators/A&P  24 year old female presents to the ED due to "yellow dot" in her left eye x 3-4 days. She is a contact lens wearer. Patient admits to mild photophobia and foreign body sensation. She also wants to know if she is able to go back to work tomorrow after testing positive for COVID on 1/13 with symptom onset on 1/12. Patent denies fever over the past 3 days. Vitals all within normal limits. Patient in no acute distress and non-ill appearing. Left eye with very small yellow dot in iris with ciliary injection. Visual acuity right 10/20 with contact in and unable to see out of left eye given no contact. Fluorescein uptake over small dot in left eye. Right eye pressure 20. Left eye pressure 21. Slit lamp performed. Discussed case with Dr. Alanda Slim with Ophthalmology who recommends Tobradex QID with outpatient follow-up with him on Monday. Instructed patient not to wear contact lens until appointment on Monday. Discussed with patient that she is cleared to go back to work tomorrow if she has been afebrile for 3 days. Strict ED precautions discussed with patient. Patient states understanding and agrees to plan. Patient discharged home in no acute distress and stable vitals  Final Clinical Impression(s) / ED Diagnoses Final diagnoses:  Ulcer of left cornea    Rx / DC Orders ED Discharge Orders         Ordered    tobramycin-dexamethasone [facility]) ophthalmic solution  4 times daily     08/13/19 1304           Karie Kirks 08/13/19 1403    Carmin Muskrat, MD 08/14/19 [phone]

## 2019-10-03 ENCOUNTER — Ambulatory Visit: Payer: Medicaid Other

## 2019-10-10 ENCOUNTER — Ambulatory Visit: Payer: Medicaid Other
